# Patient Record
Sex: Female | Born: 1980
Health system: Southern US, Community
[De-identification: ages and names within clinical notes are randomized; demographics above are authoritative.]

## PROBLEM LIST (undated history)

## (undated) DIAGNOSIS — L309 Dermatitis, unspecified: Secondary | ICD-10-CM

## (undated) DIAGNOSIS — I10 Essential (primary) hypertension: Secondary | ICD-10-CM

## (undated) DIAGNOSIS — E282 Polycystic ovarian syndrome: Secondary | ICD-10-CM

## (undated) DIAGNOSIS — F419 Anxiety disorder, unspecified: Secondary | ICD-10-CM

## (undated) DIAGNOSIS — E559 Vitamin D deficiency, unspecified: Secondary | ICD-10-CM

## (undated) DIAGNOSIS — E119 Type 2 diabetes mellitus without complications: Secondary | ICD-10-CM

## (undated) HISTORY — DX: Polycystic ovarian syndrome: E28.2

## (undated) HISTORY — PX: WISDOM TOOTH EXTRACTION: SHX21

## (undated) HISTORY — DX: Vitamin D deficiency, unspecified: E55.9

## (undated) HISTORY — DX: Essential (primary) hypertension: I10

## (undated) HISTORY — DX: Type 2 diabetes mellitus without complications: E11.9

## (undated) HISTORY — DX: Anxiety disorder, unspecified: F41.9

---

## 2015-08-04 ENCOUNTER — Encounter (HOSPITAL_BASED_OUTPATIENT_CLINIC_OR_DEPARTMENT_OTHER): Payer: Self-pay

## 2015-08-04 ENCOUNTER — Emergency Department (HOSPITAL_BASED_OUTPATIENT_CLINIC_OR_DEPARTMENT_OTHER): Payer: Self-pay

## 2015-08-04 ENCOUNTER — Emergency Department (HOSPITAL_BASED_OUTPATIENT_CLINIC_OR_DEPARTMENT_OTHER)
Admission: EM | Admit: 2015-08-04 | Discharge: 2015-08-04 | Disposition: A | Discharge status: Home / Self Care | Payer: Self-pay | Attending: Emergency Medicine | Admitting: Emergency Medicine

## 2015-08-04 DIAGNOSIS — Z872 Personal history of diseases of the skin and subcutaneous tissue: Secondary | ICD-10-CM | POA: Insufficient documentation

## 2015-08-04 DIAGNOSIS — Z3202 Encounter for pregnancy test, result negative: Secondary | ICD-10-CM | POA: Insufficient documentation

## 2015-08-04 DIAGNOSIS — Z79899 Other long term (current) drug therapy: Secondary | ICD-10-CM | POA: Insufficient documentation

## 2015-08-04 DIAGNOSIS — Z880 Allergy status to penicillin: Secondary | ICD-10-CM | POA: Insufficient documentation

## 2015-08-04 DIAGNOSIS — R202 Paresthesia of skin: Secondary | ICD-10-CM | POA: Insufficient documentation

## 2015-08-04 DIAGNOSIS — R200 Anesthesia of skin: Secondary | ICD-10-CM | POA: Insufficient documentation

## 2015-08-04 DIAGNOSIS — I10 Essential (primary) hypertension: Secondary | ICD-10-CM | POA: Insufficient documentation

## 2015-08-04 DIAGNOSIS — Z7982 Long term (current) use of aspirin: Secondary | ICD-10-CM | POA: Insufficient documentation

## 2015-08-04 HISTORY — DX: Dermatitis, unspecified: L30.9

## 2015-08-04 LAB — COMPREHENSIVE METABOLIC PANEL
ALT: 17 U/L (ref 14–54)
AST: 22 U/L (ref 15–41)
Albumin: 4.1 g/dL (ref 3.5–5.0)
Alkaline Phosphatase: 73 U/L (ref 38–126)
Anion gap: 7 (ref 5–15)
BUN: 12 mg/dL (ref 6–20)
CO2: 24 mmol/L (ref 22–32)
Calcium: 9.5 mg/dL (ref 8.9–10.3)
Chloride: 105 mmol/L (ref 101–111)
Creatinine, Ser: 0.84 mg/dL (ref 0.44–1.00)
GFR calc Af Amer: >60 mL/min (ref >60)
GFR calc non Af Amer: >60 mL/min (ref >60)
Glucose, Bld: 126 mg/dL — ABNORMAL HIGH (ref 65–99)
Potassium: 3.6 mmol/L (ref 3.5–5.1)
Sodium: 136 mmol/L (ref 135–145)
Total Bilirubin: 0.7 mg/dL (ref 0.3–1.2)
Total Protein: 7.7 g/dL (ref 6.5–8.1)

## 2015-08-04 LAB — URINALYSIS, ROUTINE W REFLEX MICROSCOPIC
Bilirubin Urine: NEGATIVE
Glucose, UA: NEGATIVE mg/dL
Ketones, ur: NEGATIVE mg/dL
Leukocytes, UA: NEGATIVE
Nitrite: NEGATIVE
Protein, ur: NEGATIVE mg/dL
Specific Gravity, Urine: 1.004 — ABNORMAL LOW (ref 1.005–1.030)
pH: 6 (ref 5.0–8.0)

## 2015-08-04 LAB — DIFFERENTIAL
Basophils Absolute: 0.1 10*3/uL (ref 0.0–0.1)
Basophils Relative: 1 %
Eosinophils Absolute: 0.1 10*3/uL (ref 0.0–0.7)
Eosinophils Relative: 1 %
Lymphocytes Relative: 21 %
Lymphs Abs: 2 10*3/uL (ref 0.7–4.0)
Monocytes Absolute: 0.4 10*3/uL (ref 0.1–1.0)
Monocytes Relative: 4 %
Neutro Abs: 6.7 10*3/uL (ref 1.7–7.7)
Neutrophils Relative %: 73 %

## 2015-08-04 LAB — PROTIME-INR
INR: 1 (ref 0.00–1.49)
Prothrombin Time: 13.4 seconds (ref 11.6–15.2)

## 2015-08-04 LAB — CBC
HCT: 41.9 % (ref 36.0–46.0)
Hemoglobin: 14.5 g/dL (ref 12.0–15.0)
MCH: 28.5 pg (ref 26.0–34.0)
MCHC: 34.6 g/dL (ref 30.0–36.0)
MCV: 82.3 fL (ref 78.0–100.0)
Platelets: 317 10*3/uL (ref 150–400)
RBC: 5.09 MIL/uL (ref 3.87–5.11)
RDW: 12.4 % (ref 11.5–15.5)
WBC: 9.2 10*3/uL (ref 4.0–10.5)

## 2015-08-04 LAB — RAPID URINE DRUG SCREEN, HOSP PERFORMED
Amphetamines: NOT DETECTED
Barbiturates: NOT DETECTED
Benzodiazepines: NOT DETECTED
Cocaine: NOT DETECTED
Opiates: NOT DETECTED
Tetrahydrocannabinol: NOT DETECTED

## 2015-08-04 LAB — URINE MICROSCOPIC-ADD ON

## 2015-08-04 LAB — ETHANOL: Alcohol, Ethyl (B): <5 mg/dL (ref <5)

## 2015-08-04 LAB — APTT: aPTT: 33 seconds (ref 24–37)

## 2015-08-04 LAB — TROPONIN I: Troponin I: <0.03 ng/mL (ref <0.031)

## 2015-08-04 LAB — PREGNANCY, URINE: Preg Test, Ur: NEGATIVE

## 2015-08-04 MED ORDER — HYDROCHLOROTHIAZIDE 25 MG PO TABS: 25.0000 mg | ORAL_TABLET | Freq: Every day | ORAL | Status: DC

## 2015-08-04 MED ORDER — ASPIRIN 81 MG PO CHEW: 81.0000 mg | CHEWABLE_TABLET | Freq: Every day | ORAL | Status: DC

## 2015-08-04 NOTE — ED Provider Notes (Signed)
CSN: [id number]     Arrival date & time 08/04/15  1227 History  By signing my name below, I, Kendra Bennett, attest that this documentation has been prepared under the direction and in the presence of Kendra Octave, MD. Electronically Signed: Sonum Bennett, Neurosurgeon. 08/04/2015. 12:56 PM.    Chief Complaint  Patient presents with  . Hypertension   The history is provided by the patient. No language interpreter was used.     HPI Comments: Kendra Bennett is a 35 y.o. female who presents to the Emergency Department requesting evaluation for elevated blood pressure today. Patient states she checked her blood pressure at home and it was in the 180's/115. She has not been diagnosed with HTN and does not take any medications for this. She reports experiencing associated intermittent right hand digits numbness/paresthesias yesterday for 1 hour. She reports right lip numbness/paresthesias that last for about 2 hours earlier today. She reports taking doxycycline for a leg wound. She denies weakness, CP, SOB, visual disturbances, blurry/double vision, HA.   Past Medical History  Diagnosis Date  . Eczema    Past Surgical History  Procedure Laterality Date  . Wisdom tooth extraction     History reviewed. No pertinent family history. Social History  Substance Use Topics  . Smoking status: Never Smoker   . Smokeless tobacco: None  . Alcohol Use: No   OB History    No data available     Review of Systems  A complete 10 system review of systems was obtained and all systems are negative except as noted in the HPI and PMH.    Allergies  Penicillins and Prednisolone  Home Medications   Prior to Admission medications   Medication Sig Start Date End Date Taking? Authorizing Provider  cetirizine (ZYRTEC) 10 MG tablet Take 10 mg by mouth daily.   Yes Historical Provider, MD  levocetirizine (XYZAL) 5 MG tablet Take 5 mg by mouth every evening.   Yes Historical Provider, MD  aspirin 81 MG chewable  tablet Chew 1 tablet (81 mg total) by mouth daily. 08/04/15   Kendra Octave, MD  hydrochlorothiazide (HYDRODIURIL) 25 MG tablet Take 1 tablet (25 mg total) by mouth daily. 08/04/15   Kendra Octave, MD   BP 130/88 mmHg  Pulse 70  Temp(Src) 98.4 F (36.9 C) (Oral)  Resp 16  Ht  (1.549 m)  Wt 170 lb (77.111 kg)  BMI 32.14 kg/m2  SpO2 100%  LMP 07/28/2015 Physical Exam  Constitutional: She is oriented to person, place, and time. She appears well-developed and well-nourished. No distress.  HENT:  Head: Normocephalic and atraumatic.  Mouth/Throat: Oropharynx is clear and moist. No oropharyngeal exudate.  Eyes: Conjunctivae and EOM are normal. Pupils are equal, round, and reactive to light.  Neck: Normal range of motion. Neck supple.  No meningismus.  Cardiovascular: Normal rate, regular rhythm, normal heart sounds and intact distal pulses.   No murmur heard. Pulmonary/Chest: Effort normal and breath sounds normal. No respiratory distress.  Abdominal: Soft. There is no tenderness. There is no rebound and no guarding.  Musculoskeletal: Normal range of motion. She exhibits no edema or tenderness.  Neurological: She is alert and oriented to person, place, and time. No cranial nerve deficit. She exhibits normal muscle tone. Coordination normal.  No ataxia on finger to nose bilaterally. No pronator drift. 5/5 strength throughout. CN 2-12 intact.Equal grip strength. Sensation intact. No sensory deficits appreciated. Negative Romberg. Normal gait.   Skin: Skin is warm.  Psychiatric: She has  a normal mood and affect. Her behavior is normal.  Nursing note and vitals reviewed.   ED Course  Procedures (including critical care time)  DIAGNOSTIC STUDIES: Oxygen Saturation is 99% on RA, normal by my interpretation.    COORDINATION OF CARE: 1:03 PM Will order labs and imaging. Discussed treatment plan with pt at bedside and pt agreed to plan.   Labs Review Labs Reviewed  COMPREHENSIVE  METABOLIC PANEL - Abnormal; Notable for the following:    Glucose, Bld 126 (*)    All other components within normal limits  URINALYSIS, ROUTINE W REFLEX MICROSCOPIC (NOT AT [facility]) - Abnormal; Notable for the following:    APPearance CLOUDY (*)    Specific Gravity, Urine 1.004 (*)    Hgb urine dipstick SMALL (*)    All other components within normal limits  URINE MICROSCOPIC-ADD ON - Abnormal; Notable for the following:    Squamous Epithelial / LPF 6-30 (*)    Bacteria, UA FEW (*)    All other components within normal limits  ETHANOL  PROTIME-INR  APTT  CBC  DIFFERENTIAL  URINE RAPID DRUG SCREEN, HOSP PERFORMED  TROPONIN I  PREGNANCY, URINE    Imaging Review Ct Head Wo Contrast  08/04/2015  CLINICAL DATA:  Right-sided facial numbness and tingling today. Elevated blood pressure. EXAM: CT HEAD WITHOUT CONTRAST TECHNIQUE: Contiguous axial images were obtained from the base of the skull through the vertex without contrast. COMPARISON:  None FINDINGS: Normal appearance of the intracranial structures. No evidence for acute hemorrhage, mass lesion, midline shift, hydrocephalus or large infarct. No acute bony abnormality. The visualized sinuses are clear. IMPRESSION: Negative head CT. Electronically Signed   By: Richarda OverlieAdam  Henn M.D.   On: 08/04/2015 14:28   I have personally reviewed and evaluated these images and lab results as part of my medical decision-making.   EKG Interpretation   Date/Time:  Sunday August 04 2015 13:13:41 EDT Ventricular Rate:  61 PR Interval:  143 QRS Duration: 99 QT Interval:  398 QTC Calculation: 401 R Axis:   -27 Text Interpretation:  Sinus rhythm Borderline left axis deviation Abnormal  R-wave progression, late transition Borderline T abnormalities, inferior  leads No previous ECGs available Confirmed by Manus GunningANCOUR  MD, Keeli Roberg [phone]([id number])  on 08/04/2015 1:31:45 PM Also confirmed by Manus GunningANCOUR  MD, Styles Fambro [phone]([id number]),  editor Dan HumphreysWALKER, CCT, SANDRA ([id number])  on 08/04/2015 2:43:29  PM      MDM   Final diagnoses:  Hypertension, essential  Paresthesias   Patient states she has been feeling "bad" for the past 3 days and checked her blood pressure at home and found to be elevated. Had tingling of her hand lasting 1 hour yesterday and some tingling of her right face today lasting several minutes which is since resolved.  Neuro exam normal on arrival. No focal motor or sensory deficits.  Code stroke not activated.  Labs normal.  CT head negative.  MRI considered for TIA or other central neuro pathology.  It is not available at this facility today.  D/w Dr. Lavon PaganiniNandigam of neurology.  He agrees patient is appropriate for outpatient workup for MRI given her normal exam now. He also feels she should be evaluated for demyleinating disease.   D/w patient and offered transfer for MRI today. She would rather follow up as outpatient.  Her neuro exam is normal now.  Will start HCTZ and have follow up with PCP and neurology.  Start baby ASA.  Return precautions discussed.  I personally performed the services described  in this documentation, which was scribed in my presence. The recorded information has been reviewed and is accurate.   Kendra Octave, MD 08/04/15 [phone]

## 2015-08-04 NOTE — Discharge Instructions (Signed)
Paresthesia Follow-up with your doctor and the neurologist. You need Additional tests to evaluate for possible transient ischemic attack or other neurological problem. You need an MRI of your brain, carotid ultrasound, ultrasound of the heart. Take the blood pressure medicine as prescribed. Return to the ED if you develop new or worsening symptoms. Paresthesia is an abnormal burning or prickling sensation. This sensation is generally felt in the hands, arms, legs, or feet. However, it may occur in any part of the body. Usually, it is not painful. The feeling may be described as:  Tingling or numbness.  Pins and needles.  Skin crawling.  Buzzing.  Limbs falling asleep.  Itching. Most people experience temporary (transient) paresthesia at some time in their lives. Paresthesia may occur when you breathe too quickly (hyperventilation). It can also occur without any apparent cause. Commonly, paresthesia occurs when pressure is placed on a nerve. The sensation quickly goes away after the pressure is removed. For some people, however, paresthesia is a long-lasting (chronic) condition that is caused by an underlying disorder. If you continue to have paresthesia, you may need further medical evaluation. HOME CARE INSTRUCTIONS Watch your condition for any changes. Taking the following actions may help to lessen any discomfort that you are feeling:  Avoid drinking alcohol.  Try acupuncture or massage to help relieve your symptoms.  Keep all follow-up visits as directed by your health care provider. This is important. SEEK MEDICAL CARE IF:  You continue to have episodes of paresthesia.  Your burning or prickling feeling gets worse when you walk.  You have pain, cramps, or dizziness.  You develop a rash. SEEK IMMEDIATE MEDICAL CARE IF:  You feel weak.  You have trouble walking or moving.  You have problems with speech, understanding, or vision.  You feel confused.  You cannot control  your bladder or bowel movements.  You have numbness after an injury.  You faint.   This information is not intended to replace advice given to you by your health care provider. Make sure you discuss any questions you have with your health care provider.   Document Released: 04/03/2002 Document Revised: 08/28/2014 Document Reviewed: 04/09/2014 Elsevier Interactive Patient Education [employer]

## 2015-08-04 NOTE — ED Notes (Addendum)
Patient here with complaint of BP being up the past few days. Reports some intermittent facial numbness and arm tingling. Denies CP, denies shortness of breath

## 2015-08-19 ENCOUNTER — Ambulatory Visit: Payer: Self-pay | Admitting: Neurology

## 2015-08-19 ENCOUNTER — Telehealth: Payer: Self-pay | Admitting: *Deleted

## 2015-08-19 NOTE — Telephone Encounter (Addendum)
Patient called the answering service at 7pm the evening prior to her Monday morning 7am appointment.

## 2015-08-20 ENCOUNTER — Encounter: Payer: Self-pay | Admitting: Neurology

## 2016-03-04 DIAGNOSIS — I10 Essential (primary) hypertension: Secondary | ICD-10-CM | POA: Diagnosis not present

## 2016-03-04 DIAGNOSIS — L309 Dermatitis, unspecified: Secondary | ICD-10-CM | POA: Diagnosis not present

## 2016-03-22 DIAGNOSIS — L209 Atopic dermatitis, unspecified: Secondary | ICD-10-CM | POA: Diagnosis not present

## 2016-05-07 DIAGNOSIS — H01003 Unspecified blepharitis right eye, unspecified eyelid: Secondary | ICD-10-CM | POA: Diagnosis not present

## 2016-05-07 DIAGNOSIS — H01006 Unspecified blepharitis left eye, unspecified eyelid: Secondary | ICD-10-CM | POA: Diagnosis not present

## 2016-05-07 DIAGNOSIS — H16101 Unspecified superficial keratitis, right eye: Secondary | ICD-10-CM | POA: Diagnosis not present

## 2016-05-07 DIAGNOSIS — H02002 Unspecified entropion of right lower eyelid: Secondary | ICD-10-CM | POA: Diagnosis not present

## 2016-05-12 DIAGNOSIS — L299 Pruritus, unspecified: Secondary | ICD-10-CM | POA: Diagnosis not present

## 2016-05-12 DIAGNOSIS — B379 Candidiasis, unspecified: Secondary | ICD-10-CM | POA: Diagnosis not present

## 2016-05-12 DIAGNOSIS — L0293 Carbuncle, unspecified: Secondary | ICD-10-CM | POA: Diagnosis not present

## 2016-05-12 DIAGNOSIS — L2089 Other atopic dermatitis: Secondary | ICD-10-CM | POA: Diagnosis not present

## 2016-08-18 DIAGNOSIS — L0293 Carbuncle, unspecified: Secondary | ICD-10-CM | POA: Diagnosis not present

## 2016-08-18 DIAGNOSIS — L219 Seborrheic dermatitis, unspecified: Secondary | ICD-10-CM | POA: Diagnosis not present

## 2016-08-18 DIAGNOSIS — L2089 Other atopic dermatitis: Secondary | ICD-10-CM | POA: Diagnosis not present

## 2016-08-18 DIAGNOSIS — L739 Follicular disorder, unspecified: Secondary | ICD-10-CM | POA: Diagnosis not present

## 2016-10-06 DIAGNOSIS — L309 Dermatitis, unspecified: Secondary | ICD-10-CM | POA: Diagnosis not present

## 2016-10-06 DIAGNOSIS — R1013 Epigastric pain: Secondary | ICD-10-CM | POA: Diagnosis not present

## 2016-10-06 DIAGNOSIS — R509 Fever, unspecified: Secondary | ICD-10-CM | POA: Diagnosis not present

## 2016-10-08 ENCOUNTER — Other Ambulatory Visit: Payer: Self-pay | Admitting: Family Medicine

## 2016-10-08 DIAGNOSIS — R1013 Epigastric pain: Secondary | ICD-10-CM

## 2016-10-16 ENCOUNTER — Other Ambulatory Visit: Payer: Self-pay | Admitting: Family Medicine

## 2016-10-16 ENCOUNTER — Ambulatory Visit
Admission: RE | Admit: 2016-10-16 | Discharge: 2016-10-16 | Disposition: A | Discharge status: Home / Self Care | Payer: BLUE CROSS/BLUE SHIELD | Source: Ambulatory Visit | Attending: Family Medicine | Admitting: Family Medicine

## 2016-10-16 DIAGNOSIS — R1013 Epigastric pain: Secondary | ICD-10-CM

## 2016-10-16 DIAGNOSIS — R109 Unspecified abdominal pain: Secondary | ICD-10-CM | POA: Diagnosis not present

## 2017-02-16 DIAGNOSIS — E559 Vitamin D deficiency, unspecified: Secondary | ICD-10-CM | POA: Diagnosis not present

## 2017-02-16 DIAGNOSIS — R7303 Prediabetes: Secondary | ICD-10-CM | POA: Diagnosis not present

## 2017-02-16 DIAGNOSIS — Z79899 Other long term (current) drug therapy: Secondary | ICD-10-CM | POA: Diagnosis not present

## 2017-02-16 DIAGNOSIS — Z0000 Encounter for general adult medical examination without abnormal findings: Secondary | ICD-10-CM | POA: Diagnosis not present

## 2017-02-16 DIAGNOSIS — I10 Essential (primary) hypertension: Secondary | ICD-10-CM | POA: Diagnosis not present

## 2017-09-28 DIAGNOSIS — L309 Dermatitis, unspecified: Secondary | ICD-10-CM | POA: Diagnosis not present

## 2017-09-28 DIAGNOSIS — L304 Erythema intertrigo: Secondary | ICD-10-CM | POA: Diagnosis not present

## 2017-09-28 DIAGNOSIS — I10 Essential (primary) hypertension: Secondary | ICD-10-CM | POA: Diagnosis not present

## 2017-09-28 DIAGNOSIS — F411 Generalized anxiety disorder: Secondary | ICD-10-CM | POA: Diagnosis not present

## 2017-11-07 IMAGING — CT CT HEAD W/O CM
1 series · 16 of 30 positions shown, 20 images · non-contrast
Comparison: None

CLINICAL DATA: Right-sided facial numbness and tingling today.
Elevated blood pressure.

EXAM:
CT HEAD WITHOUT CONTRAST
TECHNIQUE: Contiguous axial images were obtained from the base of the skull
through the vertex without contrast.

[Series 2: head wo · axial · 0.44mm/px · z∈[+794,+929]mm · 16 of 33 slices shown, 20 images]
[im 2/33  brain]
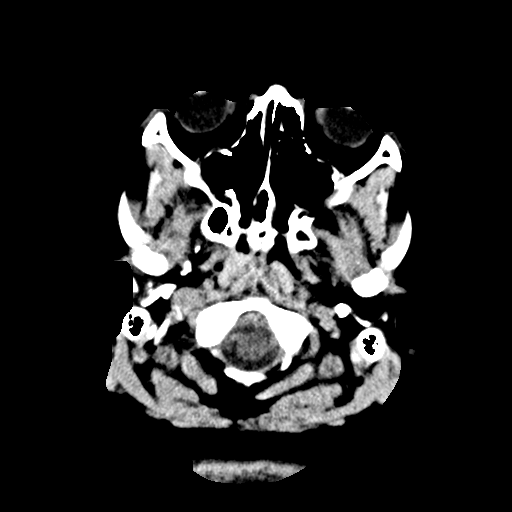
[im 2/33  bone]
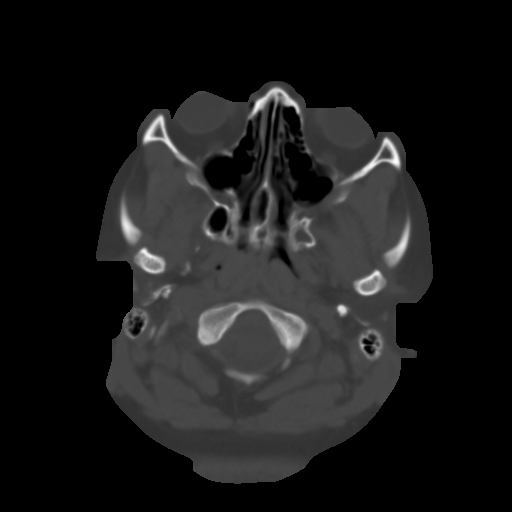
[im 4/33  brain]
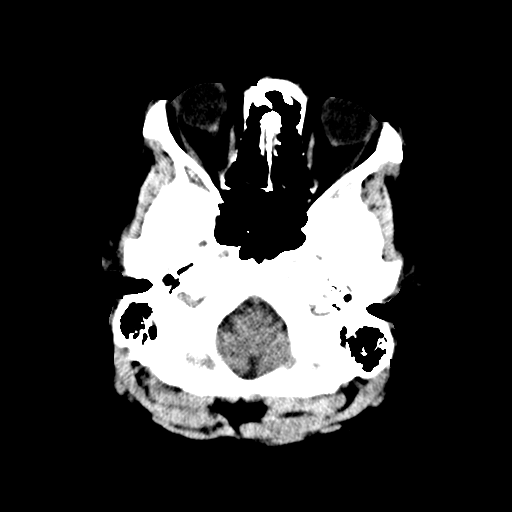
[im 6/33  brain]
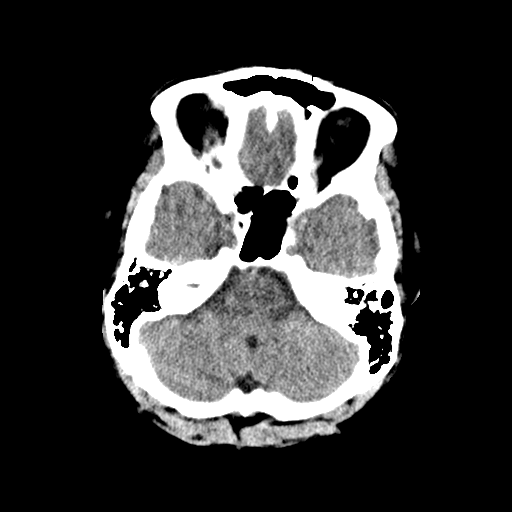
[im 8/33  brain]
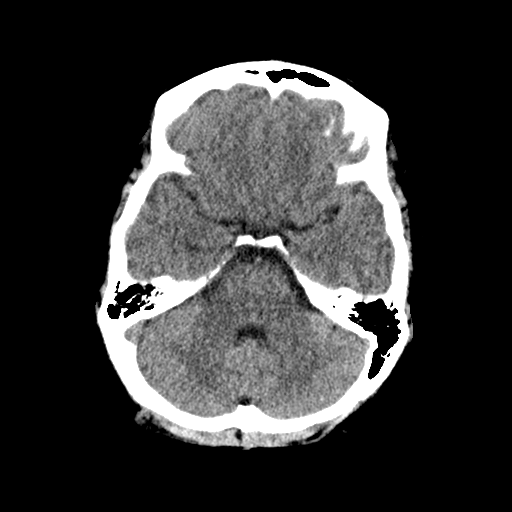
[im 9/33  brain]
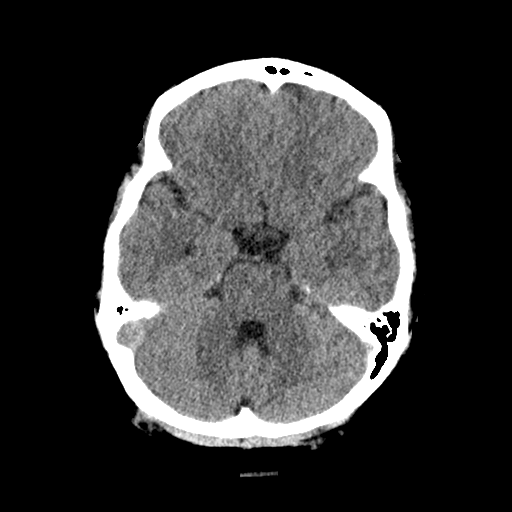
[im 9/33  bone]
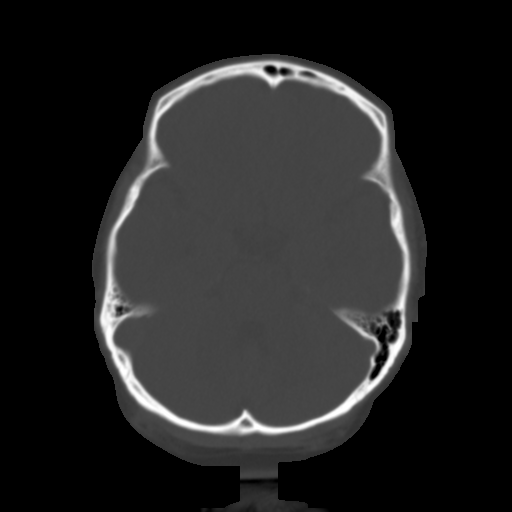
[im 12/33  brain]
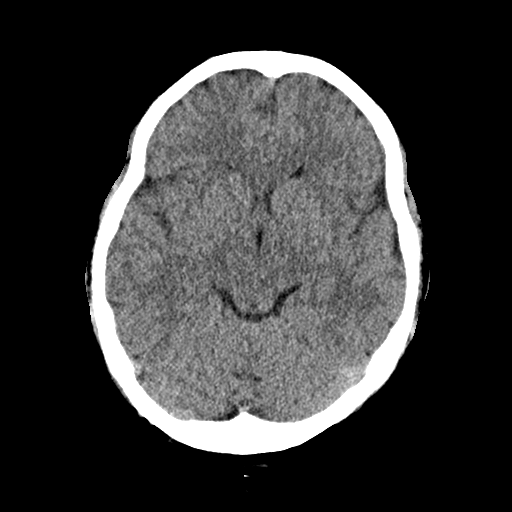
[im 14/33  brain]
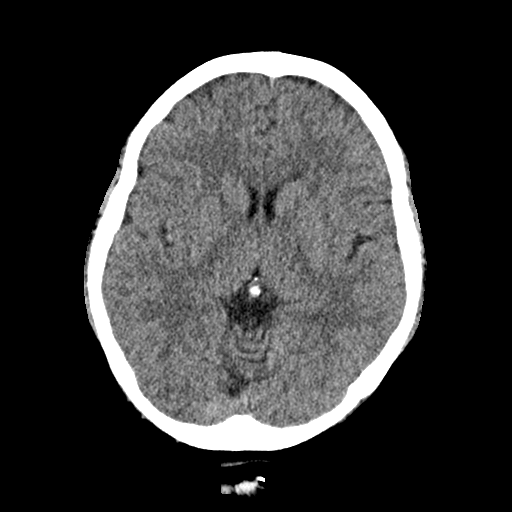
[im 16/33  brain]
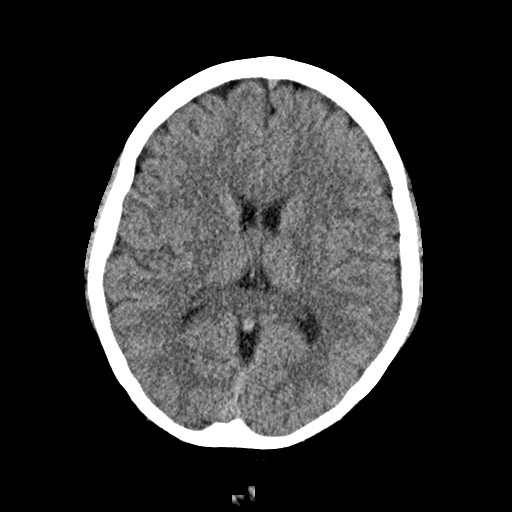
[im 17/33  brain]
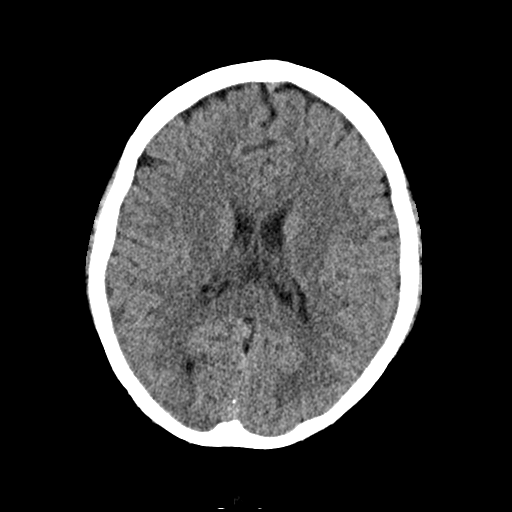
[im 17/33  bone]
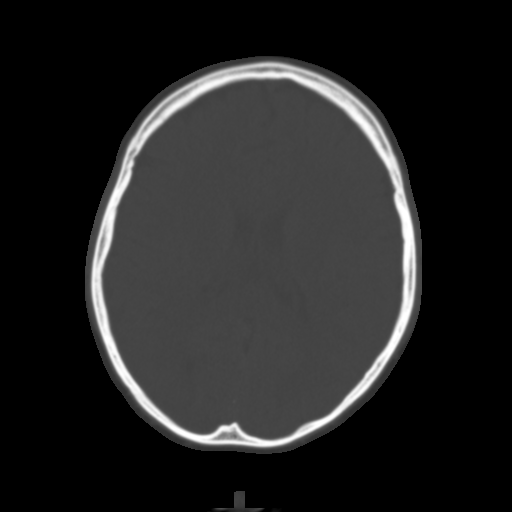
[im 19/33  brain]
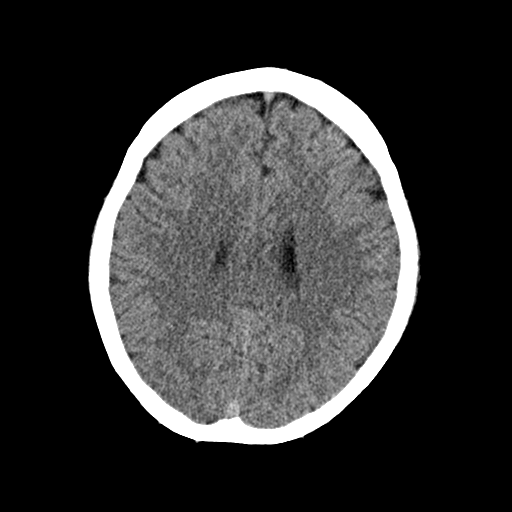
[im 21/33  brain]
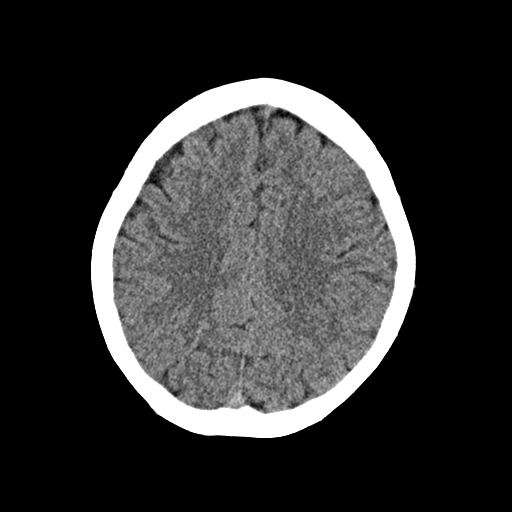
[im 24/33  brain]
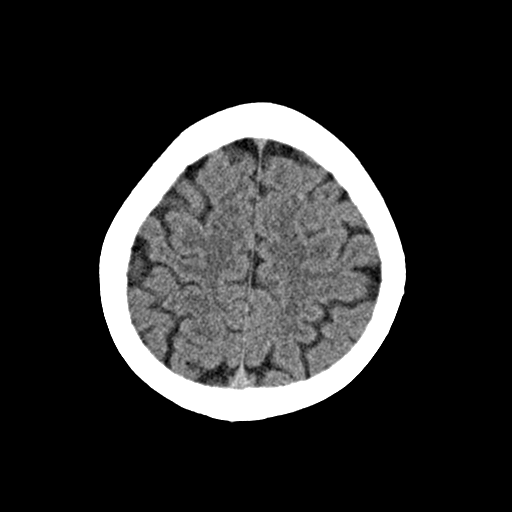
[im 25/33  brain]
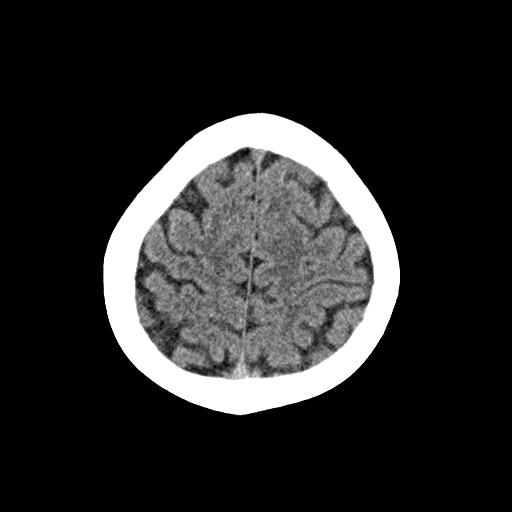
[im 25/33  bone]
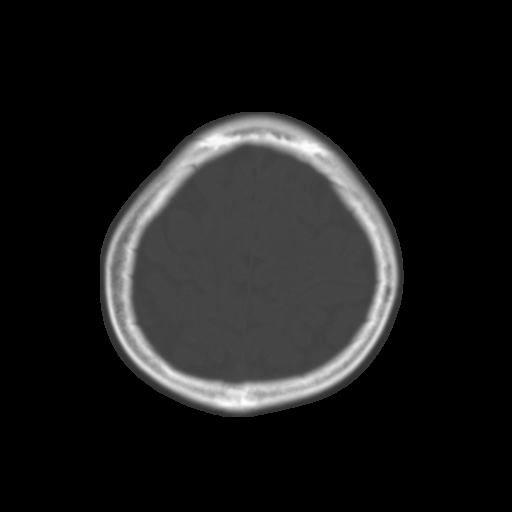
[im 27/33  brain]
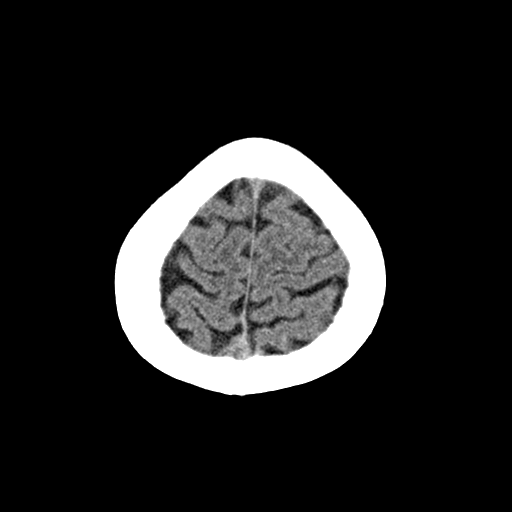
[im 29/33  brain]
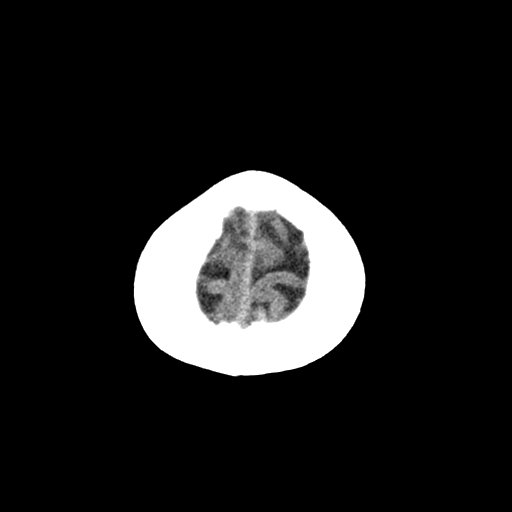
[im 31/33  brain]
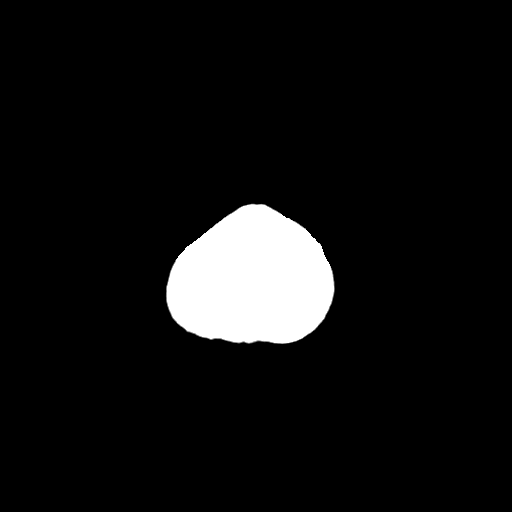

[16 of 30 positions shown; findings below may reference images not displayed]

FINDINGS: Normal appearance of the intracranial structures. No evidence for
acute hemorrhage, mass lesion, midline shift, hydrocephalus or large
infarct. No acute bony abnormality. The visualized sinuses are
clear.
IMPRESSION: Negative head CT.

## 2017-12-28 IMAGING — US US ABDOMEN COMPLETE
1 series · 13 of 25 positions shown · non-contrast
Comparison: None.

CLINICAL DATA: Abdominal pain

EXAM:
ABDOMEN ULTRASOUND COMPLETE

[Series 1: us abdomen complete · 0.22mm/px · 13 of 80 slices shown]
[im 1/80]
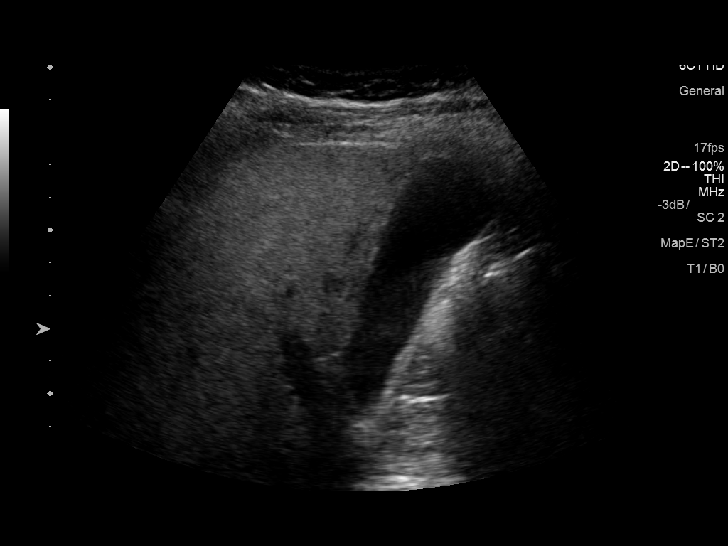
[im 7/80]
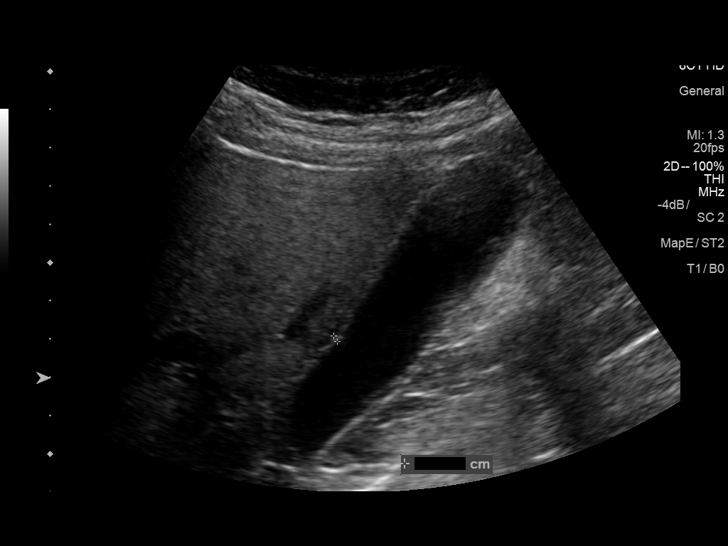
[im 14/80]
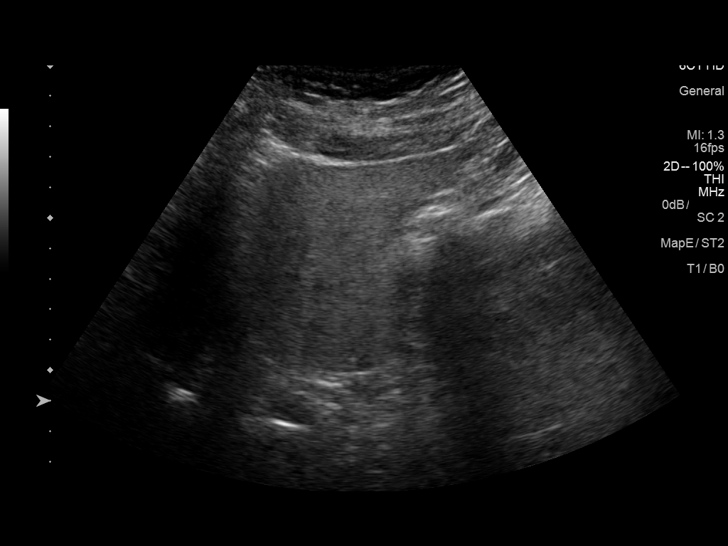
[im 20/80]
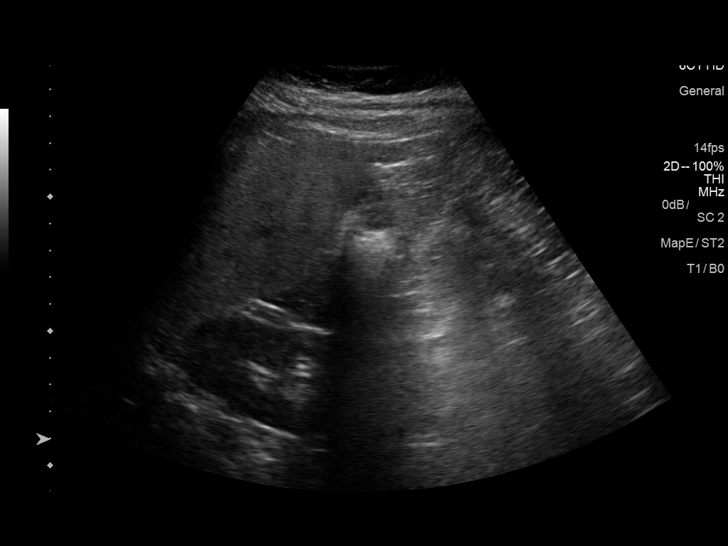
[im 27/80]
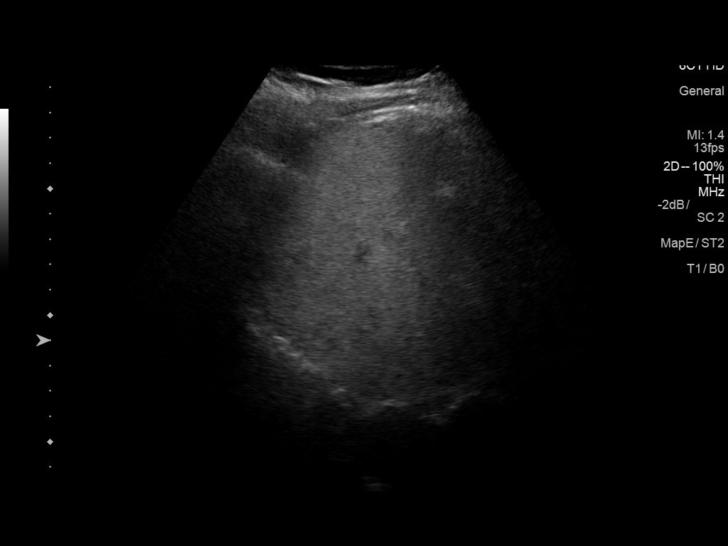
[im 33/80]
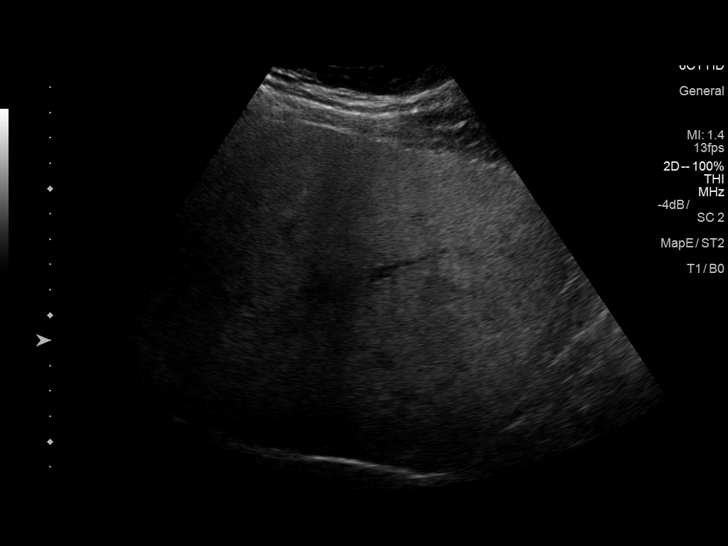
[im 40/80]
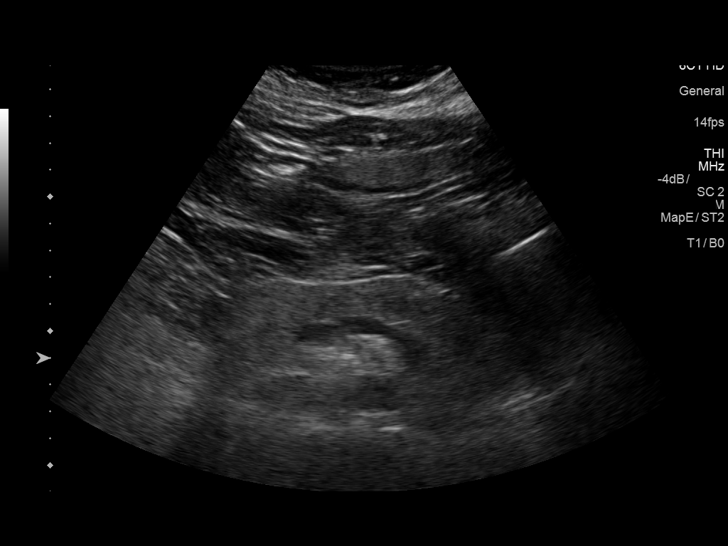
[im 47/80]
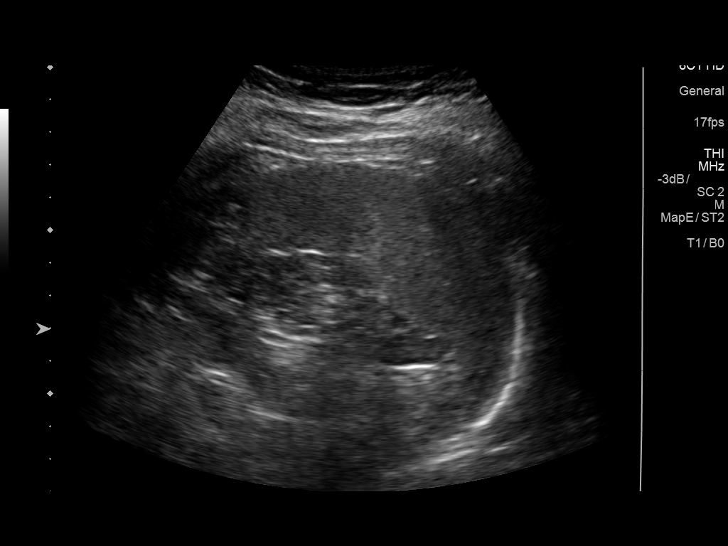
[im 53/80]
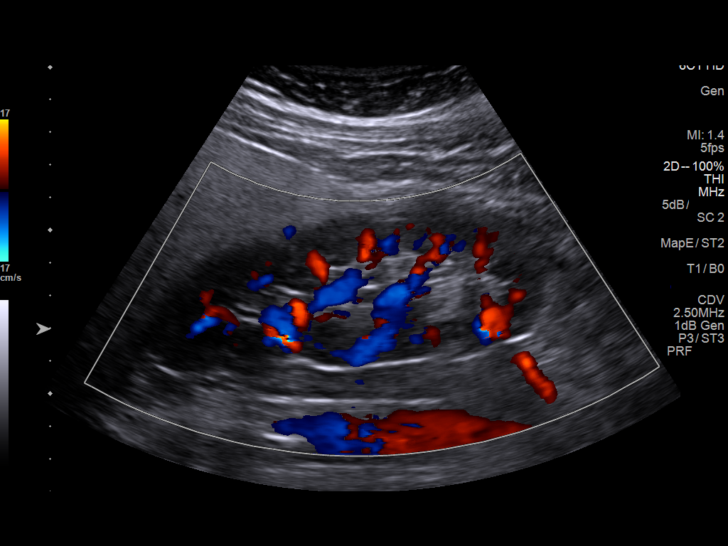
[im 60/80]
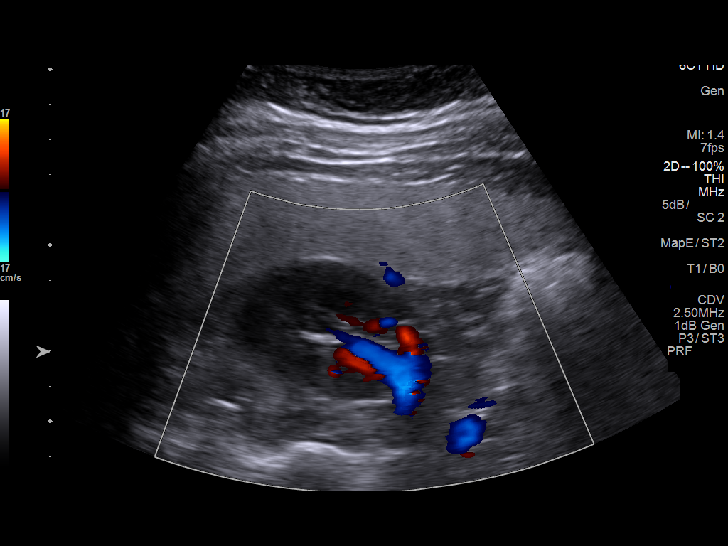
[im 66/80]
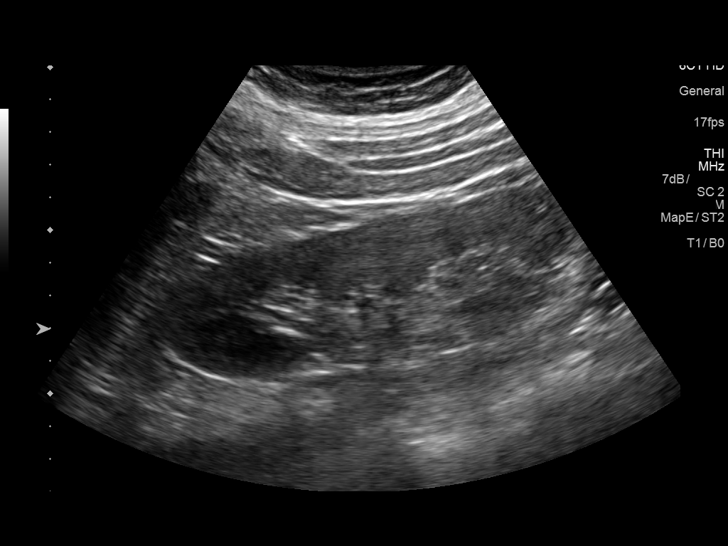
[im 73/80]
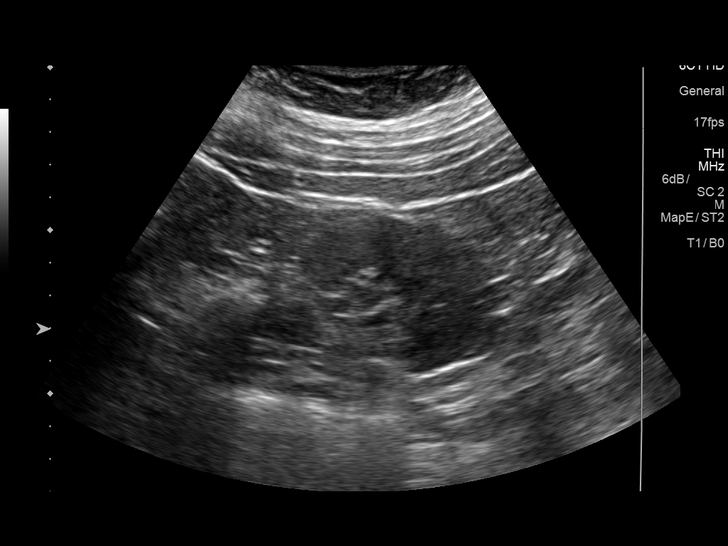
[im 80/80]
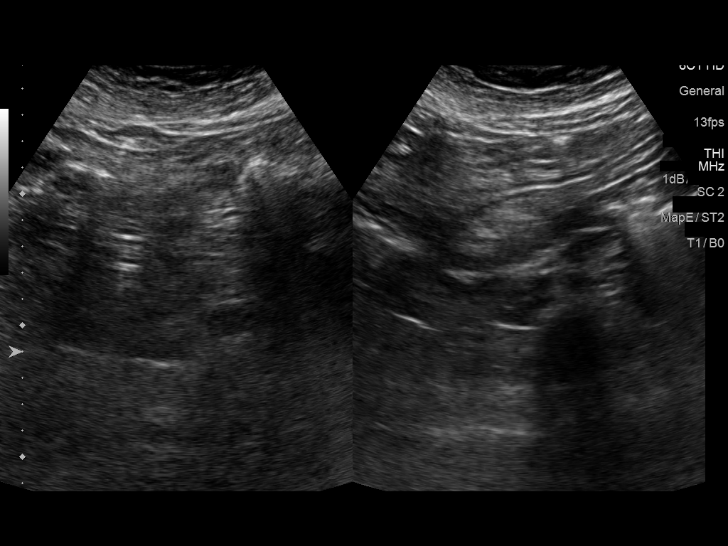

[13 of 25 positions shown; findings below may reference images not displayed]

FINDINGS: Gallbladder: No gallstones or wall thickening visualized. There is
no pericholecystic fluid. No sonographic Murphy sign noted by
sonographer.

Common bile duct: Diameter: 4 mm. There is no intrahepatic, common
hepatic, or common bile duct dilatation.

Liver: No focal lesion identified. Liver echogenicity is diffusely
increased.

IVC: No abnormality visualized in visualized regions. Portion
inferior vena cava obscured by gas.

Pancreas: Visualized portion unremarkable. Portions of pancreas
obscured by gas.

Spleen: Size and appearance within normal limits.

Right Kidney: Length: 13.4 cm. Echogenicity within normal limits. No
mass or hydronephrosis visualized.

Left Kidney: Length: 14.5 cm. Echogenicity within normal limits. No
mass or hydronephrosis visualized.

Abdominal aorta: No evident aneurysm.

Other findings: No demonstrable ascites.
IMPRESSION: Portions of pancreas and inferior vena cava obscured by gas.
Visualized portions of the studies appear normal.

Diffuse increase in liver echogenicity, a finding most likely
indicative of hepatic steatosis. While no focal liver lesions are
evident, it must be cautioned that the sensitivity of ultrasound for
detection of focal liver lesions is diminished in this circumstance.

Study otherwise unremarkable.

## 2018-01-02 DIAGNOSIS — L309 Dermatitis, unspecified: Secondary | ICD-10-CM | POA: Diagnosis not present

## 2018-01-03 DIAGNOSIS — L309 Dermatitis, unspecified: Secondary | ICD-10-CM | POA: Diagnosis not present

## 2018-01-03 DIAGNOSIS — M25571 Pain in right ankle and joints of right foot: Secondary | ICD-10-CM | POA: Diagnosis not present

## 2018-01-12 DIAGNOSIS — L662 Folliculitis decalvans: Secondary | ICD-10-CM | POA: Diagnosis not present

## 2018-01-12 DIAGNOSIS — L209 Atopic dermatitis, unspecified: Secondary | ICD-10-CM | POA: Diagnosis not present

## 2018-01-12 DIAGNOSIS — L0292 Furuncle, unspecified: Secondary | ICD-10-CM | POA: Diagnosis not present

## 2018-01-12 DIAGNOSIS — L2089 Other atopic dermatitis: Secondary | ICD-10-CM | POA: Diagnosis not present

## 2018-01-12 DIAGNOSIS — L219 Seborrheic dermatitis, unspecified: Secondary | ICD-10-CM | POA: Diagnosis not present

## 2018-01-12 DIAGNOSIS — L810 Postinflammatory hyperpigmentation: Secondary | ICD-10-CM | POA: Diagnosis not present

## 2018-03-22 DIAGNOSIS — R7303 Prediabetes: Secondary | ICD-10-CM | POA: Diagnosis not present

## 2018-03-22 DIAGNOSIS — I10 Essential (primary) hypertension: Secondary | ICD-10-CM | POA: Diagnosis not present

## 2018-03-22 DIAGNOSIS — Z0000 Encounter for general adult medical examination without abnormal findings: Secondary | ICD-10-CM | POA: Diagnosis not present

## 2018-03-22 DIAGNOSIS — Z79899 Other long term (current) drug therapy: Secondary | ICD-10-CM | POA: Diagnosis not present

## 2018-03-22 DIAGNOSIS — E559 Vitamin D deficiency, unspecified: Secondary | ICD-10-CM | POA: Diagnosis not present

## 2018-04-10 DIAGNOSIS — J209 Acute bronchitis, unspecified: Secondary | ICD-10-CM | POA: Diagnosis not present

## 2018-11-02 DIAGNOSIS — L639 Alopecia areata, unspecified: Secondary | ICD-10-CM | POA: Diagnosis not present

## 2018-11-02 DIAGNOSIS — L219 Seborrheic dermatitis, unspecified: Secondary | ICD-10-CM | POA: Diagnosis not present

## 2018-11-02 DIAGNOSIS — L2089 Other atopic dermatitis: Secondary | ICD-10-CM | POA: Diagnosis not present

## 2018-11-21 DIAGNOSIS — Z1159 Encounter for screening for other viral diseases: Secondary | ICD-10-CM | POA: Diagnosis not present

## 2018-12-26 DIAGNOSIS — Z20828 Contact with and (suspected) exposure to other viral communicable diseases: Secondary | ICD-10-CM | POA: Diagnosis not present

## 2018-12-26 DIAGNOSIS — U071 COVID-19: Secondary | ICD-10-CM | POA: Diagnosis not present

## 2019-02-02 DIAGNOSIS — Z1159 Encounter for screening for other viral diseases: Secondary | ICD-10-CM | POA: Diagnosis not present

## 2019-02-02 DIAGNOSIS — U071 COVID-19: Secondary | ICD-10-CM | POA: Diagnosis not present

## 2019-04-15 DIAGNOSIS — L209 Atopic dermatitis, unspecified: Secondary | ICD-10-CM | POA: Diagnosis not present

## 2019-04-15 DIAGNOSIS — T22211A Burn of second degree of right forearm, initial encounter: Secondary | ICD-10-CM | POA: Diagnosis not present

## 2019-04-27 DIAGNOSIS — R1110 Vomiting, unspecified: Secondary | ICD-10-CM | POA: Diagnosis not present

## 2019-04-27 DIAGNOSIS — R109 Unspecified abdominal pain: Secondary | ICD-10-CM | POA: Diagnosis not present

## 2019-04-27 DIAGNOSIS — R1111 Vomiting without nausea: Secondary | ICD-10-CM | POA: Diagnosis not present

## 2019-04-27 DIAGNOSIS — Z03818 Encounter for observation for suspected exposure to other biological agents ruled out: Secondary | ICD-10-CM | POA: Diagnosis not present

## 2019-05-24 ENCOUNTER — Other Ambulatory Visit (HOSPITAL_COMMUNITY)
Admission: RE | Admit: 2019-05-24 | Discharge: 2019-05-24 | Disposition: A | Discharge status: Home / Self Care | Payer: BC Managed Care – PPO | Source: Ambulatory Visit | Attending: Family Medicine | Admitting: Family Medicine

## 2019-05-24 ENCOUNTER — Other Ambulatory Visit: Payer: Self-pay | Admitting: Family Medicine

## 2019-05-24 DIAGNOSIS — Z01411 Encounter for gynecological examination (general) (routine) with abnormal findings: Secondary | ICD-10-CM | POA: Insufficient documentation

## 2019-05-24 DIAGNOSIS — Z79899 Other long term (current) drug therapy: Secondary | ICD-10-CM | POA: Diagnosis not present

## 2019-05-24 DIAGNOSIS — E119 Type 2 diabetes mellitus without complications: Secondary | ICD-10-CM | POA: Diagnosis not present

## 2019-05-24 DIAGNOSIS — Z0000 Encounter for general adult medical examination without abnormal findings: Secondary | ICD-10-CM | POA: Diagnosis not present

## 2019-05-24 DIAGNOSIS — E559 Vitamin D deficiency, unspecified: Secondary | ICD-10-CM | POA: Diagnosis not present

## 2019-05-30 LAB — CYTOLOGY - PAP
Chlamydia: NEGATIVE
Comment: NEGATIVE
Comment: NEGATIVE
Comment: NEGATIVE
Comment: NORMAL
Diagnosis: NEGATIVE
High risk HPV: NEGATIVE
Neisseria Gonorrhea: NEGATIVE
Trichomonas: NEGATIVE

## 2019-07-03 DIAGNOSIS — L2084 Intrinsic (allergic) eczema: Secondary | ICD-10-CM | POA: Diagnosis not present

## 2019-07-03 DIAGNOSIS — L299 Pruritus, unspecified: Secondary | ICD-10-CM | POA: Diagnosis not present

## 2019-09-15 DIAGNOSIS — H02052 Trichiasis without entropian right lower eyelid: Secondary | ICD-10-CM | POA: Diagnosis not present

## 2019-11-29 DIAGNOSIS — Z20822 Contact with and (suspected) exposure to covid-19: Secondary | ICD-10-CM | POA: Diagnosis not present

## 2019-11-29 DIAGNOSIS — Z03818 Encounter for observation for suspected exposure to other biological agents ruled out: Secondary | ICD-10-CM | POA: Diagnosis not present

## 2020-07-18 DIAGNOSIS — L810 Postinflammatory hyperpigmentation: Secondary | ICD-10-CM | POA: Diagnosis not present

## 2020-07-18 DIAGNOSIS — L299 Pruritus, unspecified: Secondary | ICD-10-CM | POA: Diagnosis not present

## 2020-07-18 DIAGNOSIS — L2089 Other atopic dermatitis: Secondary | ICD-10-CM | POA: Diagnosis not present

## 2020-08-08 DIAGNOSIS — R059 Cough, unspecified: Secondary | ICD-10-CM | POA: Diagnosis not present

## 2020-08-08 DIAGNOSIS — R0981 Nasal congestion: Secondary | ICD-10-CM | POA: Diagnosis not present

## 2020-08-08 DIAGNOSIS — J309 Allergic rhinitis, unspecified: Secondary | ICD-10-CM | POA: Diagnosis not present

## 2020-08-08 DIAGNOSIS — L209 Atopic dermatitis, unspecified: Secondary | ICD-10-CM | POA: Diagnosis not present

## 2020-12-04 DIAGNOSIS — L2084 Intrinsic (allergic) eczema: Secondary | ICD-10-CM | POA: Insufficient documentation

## 2020-12-04 DIAGNOSIS — J302 Other seasonal allergic rhinitis: Secondary | ICD-10-CM | POA: Diagnosis not present

## 2020-12-04 DIAGNOSIS — L2089 Other atopic dermatitis: Secondary | ICD-10-CM | POA: Diagnosis not present

## 2021-03-12 DIAGNOSIS — I10 Essential (primary) hypertension: Secondary | ICD-10-CM | POA: Diagnosis not present

## 2021-03-12 DIAGNOSIS — E119 Type 2 diabetes mellitus without complications: Secondary | ICD-10-CM | POA: Diagnosis not present

## 2021-03-12 DIAGNOSIS — N912 Amenorrhea, unspecified: Secondary | ICD-10-CM | POA: Diagnosis not present

## 2021-03-12 DIAGNOSIS — Z79899 Other long term (current) drug therapy: Secondary | ICD-10-CM | POA: Diagnosis not present

## 2021-03-12 DIAGNOSIS — E559 Vitamin D deficiency, unspecified: Secondary | ICD-10-CM | POA: Diagnosis not present

## 2021-06-01 DIAGNOSIS — R051 Acute cough: Secondary | ICD-10-CM | POA: Diagnosis not present

## 2021-06-01 DIAGNOSIS — R042 Hemoptysis: Secondary | ICD-10-CM | POA: Diagnosis not present

## 2021-06-01 DIAGNOSIS — U071 COVID-19: Secondary | ICD-10-CM | POA: Diagnosis not present

## 2021-09-05 ENCOUNTER — Ambulatory Visit: Payer: BC Managed Care – PPO | Admitting: Family

## 2021-09-05 ENCOUNTER — Encounter: Payer: Self-pay | Admitting: Family

## 2021-09-05 VITALS — BP 110/80 | HR 93 | Temp 98.4°F | Ht 61.0 in | Wt 188.8 lb

## 2021-09-05 DIAGNOSIS — R635 Abnormal weight gain: Secondary | ICD-10-CM

## 2021-09-05 DIAGNOSIS — I10 Essential (primary) hypertension: Secondary | ICD-10-CM

## 2021-09-05 DIAGNOSIS — L309 Dermatitis, unspecified: Secondary | ICD-10-CM

## 2021-09-05 DIAGNOSIS — E282 Polycystic ovarian syndrome: Secondary | ICD-10-CM

## 2021-09-05 DIAGNOSIS — Z6835 Body mass index (BMI) 35.0-35.9, adult: Secondary | ICD-10-CM

## 2021-09-05 DIAGNOSIS — R7309 Other abnormal glucose: Secondary | ICD-10-CM | POA: Diagnosis not present

## 2021-09-05 NOTE — Patient Instructions (Signed)
This is the note from your most recent dermatology visit- Dr. Reche Dixon ? ?"Please call our clinic at [phone] with any questions or possible complications of your skin condition or treatment. " ?

## 2021-09-05 NOTE — Progress Notes (Signed)
?Kendra Bennett is a 41 y.o. female with the following history as recorded in EpicCare:  ?There are no problems to display for this patient. ?  ?Current Outpatient Medications  ?Medication Sig Dispense Refill  ? cetirizine (ZYRTEC) 10 MG tablet Take 10 mg by mouth daily.    ? clobetasol ointment (TEMOVATE) 0.05 % Use 2 x/day to thick areas    ? desonide (DESOWEN) 0.05 % cream Apply to the face 1-2 times a day as needed for atopic dermatitis    ? DUPIXENT 300 MG/2ML prefilled syringe Inject into the skin.    ? norgestimate-ethinyl estradiol (ORTHO-CYCLEN) 0.25-35 MG-MCG tablet Take 1 tablet by mouth daily.    ? olmesartan (BENICAR) 20 MG tablet Take 10 mg by mouth daily.    ? ?No current facility-administered medications for this visit.  ?  ?Allergies: Ciprofloxacin, Penicillins, and Prednisolone  ?Past Medical History:  ?Diagnosis Date  ? Eczema   ?  ?Past Surgical History:  ?Procedure Laterality Date  ? WISDOM TOOTH EXTRACTION    ?  ?No family history on file.  ?Social History  ? ?Tobacco Use  ? Smoking status: Never  ? Smokeless tobacco: Not on file  ?Substance Use Topics  ? Alcohol use: No  ?  ?Subjective:  ?Presents today as a new patient; ?History of eczema- overdue to see her dermatologist; on [facility] x 2-3 years; initial good response but notes not feeling as beneficial recently;  ? ?Would like to discuss weight loss drugs- thinks she is pre-diabetic; notes last set of labs were done in October 2022; took Ozempic for one month and lost 8 pounds; able to keep off for about 3-4 months but admits back to "craving sugar/ hard to control appetite." ? ?Would also like to see specialist to discuss fertility options; has PCOS- periods are very irregular if she is not on OCPs;  ? ? ? ? ?Objective:  ?Vitals:  ? 09/05/21 1411  ?BP: 110/80  ?Pulse: 93  ?Temp: 98.4 ?F (36.9 ?C)  ?TempSrc: Oral  ?SpO2: 98%  ?Weight: 188 lb 12.8 oz (85.6 kg)  ?Height: '5\' 1"'  (1.549 m)  ?  ?General: Well developed, well nourished, in no  acute distress  ?Skin : Warm and dry.  ?Head: Normocephalic and atraumatic  ?Eyes: Sclera and conjunctiva clear; pupils round and reactive to light; extraocular movements intact  ?Ears: External normal; canals clear; tympanic membranes normal  ?Oropharynx: Pink, supple. No suspicious lesions  ?Neck: Supple without thyromegaly, adenopathy  ?Lungs: Respirations unlabored; clear to auscultation bilaterally without wheeze, rales, rhonchi  ?CVS exam: normal rate and regular rhythm.  ?Neurologic: Alert and oriented; speech intact; face symmetrical; moves all extremities well; CNII-XII intact without focal deficit  ? ?Assessment:  ?1. BMI 35.0-35.9,adult   ?2. Weight gain   ?3. Elevated glucose   ?4. PCOS (polycystic ovarian syndrome)   ?5. Primary hypertension   ?6. Eczema, unspecified type   ?  ?Plan:  ?Will update labs today; to consider trial of weight loss medication based on Hgba1c; follow up to be determined; ?2. Will refer to fertility specialist; ?3.  Stable; continue same medications for now- she understands this medication will need to be changed if she does decide to pursue pregnancy; ?4. She will follow up with her dermatologist- overdue for OV;  ?Follow up to be determined based on lab results;  ?No follow-ups on file.  ?Orders Placed This Encounter  ?Procedures  ? CBC with Differential/Platelet  ? Comp Met (CMET)  ? Hemoglobin A1c  ?  TSH  ? Amb Ref to Medical Weight Management  ?  Referral Priority:   Routine  ?  Referral Type:   Consultation  ?  Number of Visits Requested:   1  ? Ambulatory referral to Endocrinology  ?  Referral Priority:   Routine  ?  Referral Type:   Consultation  ?  Referral Reason:   Specialty Services Required  ?  Number of Visits Requested:   1  ?  ?Requested Prescriptions  ? ? No prescriptions requested or ordered in this encounter  ?  ? ?

## 2021-09-06 LAB — COMPREHENSIVE METABOLIC PANEL
AG Ratio: 1.5 (calc) (ref 1.0–2.5)
ALT: 13 U/L (ref 6–29)
AST: 12 U/L (ref 10–30)
Albumin: 4.3 g/dL (ref 3.6–5.1)
Alkaline phosphatase (APISO): 68 U/L (ref 31–125)
BUN: 8 mg/dL (ref 7–25)
CO2: 22 mmol/L (ref 20–32)
Calcium: 9.6 mg/dL (ref 8.6–10.2)
Chloride: 101 mmol/L (ref 98–110)
Creat: 0.61 mg/dL (ref 0.50–0.99)
Globulin: 2.9 g/dL (calc) (ref 1.9–3.7)
Glucose, Bld: 111 mg/dL — ABNORMAL HIGH (ref 65–99)
Potassium: 4 mmol/L (ref 3.5–5.3)
Sodium: 136 mmol/L (ref 135–146)
Total Bilirubin: 0.6 mg/dL (ref 0.2–1.2)
Total Protein: 7.2 g/dL (ref 6.1–8.1)

## 2021-09-06 LAB — CBC WITH DIFFERENTIAL/PLATELET
Absolute Monocytes: 427 cells/uL (ref 200–950)
Basophils Absolute: 62 cells/uL (ref 0–200)
Basophils Relative: 0.7 %
Eosinophils Absolute: 214 cells/uL (ref 15–500)
Eosinophils Relative: 2.4 %
HCT: 44.4 % (ref 35.0–45.0)
Hemoglobin: 14.6 g/dL (ref 11.7–15.5)
Lymphs Abs: 2109 cells/uL (ref 850–3900)
MCH: 27.7 pg (ref 27.0–33.0)
MCHC: 32.9 g/dL (ref 32.0–36.0)
MCV: 84.1 fL (ref 80.0–100.0)
MPV: 9.9 fL (ref 7.5–12.5)
Monocytes Relative: 4.8 %
Neutro Abs: 6088 cells/uL (ref 1500–7800)
Neutrophils Relative %: 68.4 %
Platelets: 298 10*3/uL (ref 140–400)
RBC: 5.28 10*6/uL — ABNORMAL HIGH (ref 3.80–5.10)
RDW: 12.8 % (ref 11.0–15.0)
Total Lymphocyte: 23.7 %
WBC: 8.9 10*3/uL (ref 3.8–10.8)

## 2021-09-06 LAB — TSH: TSH: 2.21 mIU/L

## 2021-09-06 LAB — HEMOGLOBIN A1C
Hgb A1c MFr Bld: 7.1 % of total Hgb — ABNORMAL HIGH (ref <5.7)
Mean Plasma Glucose: 157 mg/dL
eAG (mmol/L): 8.7 mmol/L

## 2021-09-09 ENCOUNTER — Other Ambulatory Visit: Payer: Self-pay | Admitting: Family

## 2021-09-09 MED ORDER — METFORMIN HCL 500 MG PO TABS: 500.0000 mg | ORAL_TABLET | Freq: Two times a day (BID) | ORAL | 0 refills | Status: DC

## 2021-09-24 ENCOUNTER — Encounter: Payer: Self-pay | Admitting: Family

## 2021-09-24 ENCOUNTER — Telehealth: Payer: Self-pay | Admitting: Family

## 2021-09-24 NOTE — Telephone Encounter (Signed)
Pt would like to know if she can be prescribed a rx for ozempic or mounjaro for blood sugar.

## 2021-09-24 NOTE — Telephone Encounter (Signed)
Called patient and she stated that she feels like the metformin is not working.  I asked was she checking her sugars and if so what was the readings.  She said no that she was not checking her sugars, she just know that its not working.

## 2021-09-26 ENCOUNTER — Other Ambulatory Visit: Payer: Self-pay | Admitting: Family

## 2021-09-26 MED ORDER — TIRZEPATIDE 2.5 MG/0.5ML ~~LOC~~ SOAJ: 2.5000 mg | SUBCUTANEOUS | 0 refills | Status: DC

## 2021-09-26 NOTE — Telephone Encounter (Signed)
Spoke with patient and she stated that she is planning on getting pregnant may next year.  She wants to try the [facility].  She has an appointment with the fertility clinic.  I asked did she want to wait til see what they say and she stated that she just want to start the medication because she does not want to be a diabetic and pregnant.

## 2021-10-03 ENCOUNTER — Encounter: Payer: Self-pay | Admitting: Family

## 2021-10-07 ENCOUNTER — Other Ambulatory Visit: Payer: Self-pay | Admitting: Family

## 2021-10-07 MED ORDER — TRULICITY 0.75 MG/0.5ML ~~LOC~~ SOAJ: 0.7500 mg | SUBCUTANEOUS | 2 refills | Status: DC

## 2021-10-08 ENCOUNTER — Telehealth: Payer: Self-pay

## 2021-10-08 NOTE — Telephone Encounter (Signed)
PA initiated via Covermymeds; KEY: [id number]. Awaiting determination.

## 2021-10-08 NOTE — Telephone Encounter (Signed)
PA approved. Effective 10/08/2021 to 10/08/2022

## 2021-10-08 NOTE — Telephone Encounter (Signed)
Trulicity not covered- Pt must first try and fail metformin.

## 2021-10-10 DIAGNOSIS — L2082 Flexural eczema: Secondary | ICD-10-CM | POA: Diagnosis not present

## 2021-10-14 ENCOUNTER — Other Ambulatory Visit: Payer: Self-pay | Admitting: Family

## 2021-10-31 DIAGNOSIS — L209 Atopic dermatitis, unspecified: Secondary | ICD-10-CM | POA: Diagnosis not present

## 2021-10-31 DIAGNOSIS — L700 Acne vulgaris: Secondary | ICD-10-CM | POA: Diagnosis not present

## 2021-11-01 ENCOUNTER — Encounter: Payer: Self-pay | Admitting: Family

## 2021-11-30 ENCOUNTER — Other Ambulatory Visit: Payer: Self-pay | Admitting: Family

## 2021-12-24 ENCOUNTER — Encounter (INDEPENDENT_AMBULATORY_CARE_PROVIDER_SITE_OTHER): Payer: Self-pay

## 2021-12-24 DIAGNOSIS — L2089 Other atopic dermatitis: Secondary | ICD-10-CM | POA: Diagnosis not present

## 2021-12-24 DIAGNOSIS — Z5181 Encounter for therapeutic drug level monitoring: Secondary | ICD-10-CM | POA: Diagnosis not present

## 2021-12-24 DIAGNOSIS — Z0289 Encounter for other administrative examinations: Secondary | ICD-10-CM

## 2021-12-24 DIAGNOSIS — L810 Postinflammatory hyperpigmentation: Secondary | ICD-10-CM | POA: Diagnosis not present

## 2022-01-01 ENCOUNTER — Encounter: Payer: Self-pay | Admitting: Family

## 2022-01-03 ENCOUNTER — Other Ambulatory Visit: Payer: Self-pay | Admitting: Family

## 2022-01-08 ENCOUNTER — Ambulatory Visit: Payer: BC Managed Care – PPO | Admitting: Family

## 2022-01-12 ENCOUNTER — Ambulatory Visit: Payer: BC Managed Care – PPO | Admitting: Bariatrics

## 2022-01-12 ENCOUNTER — Encounter: Payer: Self-pay | Admitting: Bariatrics

## 2022-01-12 VITALS — BP 113/75 | HR 70 | Temp 97.6°F | Ht 61.0 in | Wt 187.0 lb

## 2022-01-12 DIAGNOSIS — E282 Polycystic ovarian syndrome: Secondary | ICD-10-CM | POA: Diagnosis not present

## 2022-01-12 DIAGNOSIS — R5383 Other fatigue: Secondary | ICD-10-CM

## 2022-01-12 DIAGNOSIS — E669 Obesity, unspecified: Secondary | ICD-10-CM | POA: Diagnosis not present

## 2022-01-12 DIAGNOSIS — Z1331 Encounter for screening for depression: Secondary | ICD-10-CM

## 2022-01-12 DIAGNOSIS — Z0000 Encounter for general adult medical examination without abnormal findings: Secondary | ICD-10-CM | POA: Insufficient documentation

## 2022-01-12 DIAGNOSIS — E1169 Type 2 diabetes mellitus with other specified complication: Secondary | ICD-10-CM | POA: Diagnosis not present

## 2022-01-12 DIAGNOSIS — E538 Deficiency of other specified B group vitamins: Secondary | ICD-10-CM | POA: Diagnosis not present

## 2022-01-12 DIAGNOSIS — E559 Vitamin D deficiency, unspecified: Secondary | ICD-10-CM

## 2022-01-12 DIAGNOSIS — R0602 Shortness of breath: Secondary | ICD-10-CM | POA: Diagnosis not present

## 2022-01-12 DIAGNOSIS — I10 Essential (primary) hypertension: Secondary | ICD-10-CM | POA: Diagnosis not present

## 2022-01-12 DIAGNOSIS — E66812 Obesity, class 2: Secondary | ICD-10-CM | POA: Insufficient documentation

## 2022-01-12 DIAGNOSIS — Z6835 Body mass index (BMI) 35.0-35.9, adult: Secondary | ICD-10-CM

## 2022-01-12 DIAGNOSIS — Z7985 Long-term (current) use of injectable non-insulin antidiabetic drugs: Secondary | ICD-10-CM

## 2022-01-12 MED ORDER — TRULICITY 1.5 MG/0.5ML ~~LOC~~ SOAJ: 1.5000 mg | SUBCUTANEOUS | 0 refills | Status: DC

## 2022-01-13 LAB — COMPREHENSIVE METABOLIC PANEL
ALT: 13 IU/L (ref 0–32)
AST: 11 IU/L (ref 0–40)
Albumin/Globulin Ratio: 1.7 (ref 1.2–2.2)
Albumin: 4.3 g/dL (ref 3.9–4.9)
Alkaline Phosphatase: 80 IU/L (ref 44–121)
BUN/Creatinine Ratio: 11 (ref 9–23)
BUN: 8 mg/dL (ref 6–24)
Bilirubin Total: 0.7 mg/dL (ref 0.0–1.2)
CO2: 23 mmol/L (ref 20–29)
Calcium: 9.8 mg/dL (ref 8.7–10.2)
Chloride: 98 mmol/L (ref 96–106)
Creatinine, Ser: 0.75 mg/dL (ref 0.57–1.00)
Globulin, Total: 2.6 g/dL (ref 1.5–4.5)
Glucose: 110 mg/dL — ABNORMAL HIGH (ref 70–99)
Potassium: 3.9 mmol/L (ref 3.5–5.2)
Sodium: 138 mmol/L (ref 134–144)
Total Protein: 6.9 g/dL (ref 6.0–8.5)
eGFR: 101 mL/min/{1.73_m2} (ref >59)

## 2022-01-13 LAB — TSH+T4F+T3FREE
Free T4: 1.53 ng/dL (ref 0.82–1.77)
T3, Free: 3.5 pg/mL (ref 2.0–4.4)
TSH: 3.02 u[IU]/mL (ref 0.450–4.500)

## 2022-01-13 LAB — LIPID PANEL WITH LDL/HDL RATIO
Cholesterol, Total: 170 mg/dL (ref 100–199)
HDL: 48 mg/dL (ref >39)
LDL Chol Calc (NIH): 99 mg/dL (ref 0–99)
LDL/HDL Ratio: 2.1 ratio (ref 0.0–3.2)
Triglycerides: 131 mg/dL (ref 0–149)
VLDL Cholesterol Cal: 23 mg/dL (ref 5–40)

## 2022-01-13 LAB — VITAMIN B12: Vitamin B-12: 357 pg/mL (ref 232–1245)

## 2022-01-13 LAB — HEMOGLOBIN A1C
Est. average glucose Bld gHb Est-mCnc: 143 mg/dL
Hgb A1c MFr Bld: 6.6 % — ABNORMAL HIGH (ref 4.8–5.6)

## 2022-01-13 LAB — INSULIN, RANDOM: INSULIN: 23.1 u[IU]/mL (ref 2.6–24.9)

## 2022-01-13 LAB — VITAMIN D 25 HYDROXY (VIT D DEFICIENCY, FRACTURES): Vit D, 25-Hydroxy: 21.3 ng/mL — ABNORMAL LOW (ref 30.0–100.0)

## 2022-01-19 NOTE — Progress Notes (Unsigned)
Chief Complaint:   OBESITY Kendra Bennett (MR# [id number]) is a 41 y.o. female who presents for evaluation and treatment of obesity and related comorbidities. Current BMI is Body mass index is 35.33 kg/m. Kendra Bennett has been struggling with her weight for many years and has been unsuccessful in either losing weight, maintaining weight loss, or reaching her healthy weight goal.  Kendra Bennett is here for and initial visit. She likes to cook but she notes time as an obstacle.   Kendra Bennett is currently in the action stage of change and ready to dedicate time achieving and maintaining a healthier weight. Kendra Bennett is interested in becoming our patient and working on intensive lifestyle modifications including (but not limited to) diet and exercise for weight loss.  Kendra Bennett's habits were reviewed today and are as follows: Her family eats meals together, she thinks her family will eat healthier with her, she struggles with family and or coworkers weight loss sabotage, her desired weight loss is 37-47 lbs, she has been heavy most of her life, she started gaining weight at 41 years old, her heaviest weight ever was 188 pounds, she has significant food cravings issues, she snacks frequently in the evenings, she wakes up frequently in the middle of the night to eat, she skips meals frequently, she is frequently drinking liquids with calories, she frequently makes poor food choices, she has problems with excessive hunger, she frequently eats larger portions than normal, and she struggles with emotional eating.  Depression Screen Kendra Bennett's Food and Mood (modified PHQ-9) score was 6.     01/12/2022    7:27 AM  Depression screen PHQ 2/9  Decreased Interest 3  Down, Depressed, Hopeless 0  PHQ - 2 Score 3  Altered sleeping 0  Tired, decreased energy 3  Change in appetite 0  Feeling bad or failure about yourself  0  Trouble concentrating 0  Moving slowly or fidgety/restless 0  Suicidal thoughts 0  PHQ-9  Score 6  Difficult doing work/chores Not difficult at all   Subjective:   1. Other fatigue Kendra Bennett admits to daytime somnolence and admits to waking up still tired. Patient has a history of symptoms of daytime fatigue and morning fatigue. Kendra Bennett generally gets 4 or 5 hours of sleep per night, and states that she has nightime awakenings. Snoring is present. Apneic episodes are not present. Epworth Sleepiness Score is 14.   2. SOB (shortness of breath) on exertion Kendra Bennett notes increasing shortness of breath with exercising and seems to be worsening over time with weight gain. She notes getting out of breath sooner with activity than she used to. This has not gotten worse recently. Kendra Bennett denies shortness of breath at rest or orthopnea.  3. PCOS (polycystic ovarian syndrome) Kendra Bennett is taking birth control pills currently. She has tried metformin but had diarrhea, nausea, and vomiting.   4. Essential hypertension Kendra Bennett is taking olmesartan, and her blood pressure is controlled.   5. Diabetes mellitus type 2 in obese (HCC) Kendra Bennett is taking Trulicity. Her last A1c was 7.1.   6. Health care maintenance Given obesity.   7. B12 deficiency Kendra Bennett is not on B12 supplements.   8. Vitamin D deficiency Kendra Bennett is not on Vitamin D supplements.  Assessment/Plan:   1. Other fatigue Kendra Bennett does feel that her weight is causing her energy to be lower than it should be. Fatigue may be related to obesity, depression or many other causes. Labs will be ordered, and in the meanwhile, Kendra Bennett will focus on self  care including making healthy food choices, increasing physical activity and focusing on stress reduction.  - EKG 12-Lead - Comprehensive metabolic panel - Hemoglobin A1c - Lipid Panel With LDL/HDL Ratio - TSH+T4F+T3Free  2. SOB (shortness of breath) on exertion Kendra Bennett does feel that she gets out of breath more easily that she used to when she exercises. Kendra Bennett's  shortness of breath appears to be obesity related and exercise induced. She has agreed to work on weight loss and gradually increase exercise to treat her exercise induced shortness of breath. Will continue to monitor closely.  - TSH+T4F+T3Free  3. PCOS (polycystic ovarian syndrome) We will check labs today. Kendra Bennett will continue her medications, and she will work on cutting carbohydrates.   - Comprehensive metabolic panel - Hemoglobin A1c  4. Essential hypertension Kendra Bennett will continue her medications as directed.   5. Diabetes mellitus type 2 in obese [name]) We will check labs today. Kendra Bennett will continue her medications, and she agreed to increase Trulicity from 0.75 mg to 1.5 mg once weekly with no refills.   - Hemoglobin A1c - Insulin, random - Lipid Panel With LDL/HDL Ratio  6. Health care maintenance We will check labs today. EKG and IC were done today and reviewed with the patient.   7. B12 deficiency We will check labs today, and we will follow-up at Kendra Bennett's next visit.  - Vitamin B12  8. Vitamin D deficiency We will check labs today, and we will follow-up at Kendra Bennett's next visit.  - VITAMIN D 25 Hydroxy (Vit-D Deficiency, Fractures)  9. Depression screening Kendra Bennett had a positive depression screening. Depression is commonly associated with obesity and often results in emotional eating behaviors. We will monitor this closely and work on CBT to help improve the non-hunger eating patterns. Referral to Psychology may be required if no improvement is seen as she continues in our clinic.  10. Obesity, Current BMI 35.4 Kendra Bennett is currently in the action stage of change and her goal is to continue with weight loss efforts. I recommend Kendra Bennett begin the structured treatment plan as follows:  She has agreed to the Category 2 Plan.  Meal planning and intentional eating were discussed. She will decrease her snacking at night. Reviewed labs with the patient from  09/05/2021, CMP, CBC, A1c, TSH.   Exercise goals: No exercise has been prescribed at this time.   Behavioral modification strategies: increasing lean protein intake, decreasing simple carbohydrates, increasing vegetables, increasing water intake, decreasing eating out, no skipping meals, meal planning and cooking strategies, keeping healthy foods in the home, and planning for success.  She was informed of the importance of frequent follow-up visits to maximize her success with intensive lifestyle modifications for her multiple health conditions. She was informed we would discuss her lab results at her next visit unless there is a critical issue that needs to be addressed sooner. Kendra Bennett agreed to keep her next visit at the agreed upon time to discuss these results.  Objective:   Blood pressure 113/75, pulse 70, temperature 97.6 F (36.4 C), height 5\' 1"  (1.549 m), weight 187 lb (84.8 kg), SpO2 99 %. Body mass index is 35.33 kg/m.  EKG: Normal sinus rhythm, rate 66 BPM.  Indirect Calorimeter completed today shows a VO2 of 273 and a REE of 1886.  Her calculated basal metabolic rate is thus her basal metabolic rate is better than expected.  General: Cooperative, alert, well developed, in no acute distress. HEENT: Conjunctivae and lids unremarkable. Cardiovascular: Regular rhythm.  Lungs: Normal  work of breathing. Neurologic: No focal deficits.   Lab Results  Component Value Date   CREATININE 0.75 01/12/2022   BUN 8 01/12/2022   NA 138 01/12/2022   K 3.9 01/12/2022   CL 98 01/12/2022   CO2 23 01/12/2022   Lab Results  Component Value Date   ALT 13 01/12/2022   AST 11 01/12/2022   ALKPHOS 80 01/12/2022   BILITOT 0.7 01/12/2022   Lab Results  Component Value Date   HGBA1C 6.6 (H) 01/12/2022   HGBA1C 7.1 (H) 09/05/2021   Lab Results  Component Value Date   INSULIN 23.1 01/12/2022   Lab Results  Component Value Date   TSH 3.020 01/12/2022   Lab Results  Component  Value Date   CHOL 170 01/12/2022   HDL 48 01/12/2022   LDLCALC 99 01/12/2022   TRIG 131 01/12/2022   Lab Results  Component Value Date   WBC 8.9 09/05/2021   HGB 14.6 09/05/2021   HCT 44.4 09/05/2021   MCV 84.1 09/05/2021   PLT 298 09/05/2021   No results found for: "IRON", "TIBC", "FERRITIN"  Attestation Statements:   Reviewed by clinician on day of visit: allergies, medications, problem list, medical history, surgical history, family history, social history, and previous encounter notes.   Wilhemena Durie, am acting as Location manager for [employer], DO.  I have reviewed the above documentation for accuracy and completeness, and I agree with the above. Jearld Lesch, DO

## 2022-01-20 ENCOUNTER — Encounter: Payer: Self-pay | Admitting: Bariatrics

## 2022-01-26 ENCOUNTER — Ambulatory Visit: Payer: BC Managed Care – PPO | Admitting: Bariatrics

## 2022-01-26 ENCOUNTER — Encounter: Payer: Self-pay | Admitting: Bariatrics

## 2022-01-26 VITALS — BP 110/74 | HR 85 | Temp 97.9°F | Ht 61.0 in | Wt 187.0 lb

## 2022-01-26 DIAGNOSIS — E559 Vitamin D deficiency, unspecified: Secondary | ICD-10-CM

## 2022-01-26 DIAGNOSIS — E669 Obesity, unspecified: Secondary | ICD-10-CM

## 2022-01-26 DIAGNOSIS — E1169 Type 2 diabetes mellitus with other specified complication: Secondary | ICD-10-CM

## 2022-01-26 DIAGNOSIS — Z6835 Body mass index (BMI) 35.0-35.9, adult: Secondary | ICD-10-CM | POA: Diagnosis not present

## 2022-01-26 DIAGNOSIS — Z7985 Long-term (current) use of injectable non-insulin antidiabetic drugs: Secondary | ICD-10-CM

## 2022-01-26 MED ORDER — VITAMIN D (ERGOCALCIFEROL) 1.25 MG (50000 UNIT) PO CAPS: 50000.0000 [IU] | ORAL_CAPSULE | ORAL | 0 refills | Status: DC

## 2022-01-27 NOTE — Progress Notes (Unsigned)
Chief Complaint:   OBESITY Kendra Bennett is here to discuss her progress with her obesity treatment plan along with follow-up of her obesity related diagnoses. Kendra Bennett is on the Category 2 Plan and states she is following her eating plan approximately 50% of the time. Kendra Bennett states she is doing 0 minutes 0 times per week.  Today's visit was #: 2 Starting weight: 187 lbs Starting date: 01/12/2022 Today's weight: 187 lbs Today's date: 01/26/2022 Total lbs lost to date: 0 Total lbs lost since last in-office visit: 0  Interim History: Kendra Bennett's weight remains the same since her first visit.  She states that the plan was hard for the first week.  Subjective:   1. Vitamin D insufficiency Kendra Bennett is not on Vitamin D currently.   2. Diabetes mellitus type 2 in obese ([place]) Kendra Bennett is taking Trulicity.  Her last A1c was 6.6 and insulin 23.1.  Assessment/Plan:   1. Vitamin D insufficiency Kendra Bennett agreed to start prescription Vitamin D 50,000 IU every week, with no refills. She will follow-up for routine testing of Vitamin D, at least 2-3 times per year to avoid over-replacement.  - Vitamin D, Ergocalciferol, (DRISDOL) 1.25 MG (50000 UNIT) CAPS capsule; Take 1 capsule (50,000 Units total) by mouth every 7 (seven) days.  Dispense: 5 capsule; Refill: 0  2. Diabetes mellitus type 2 in obese ([place]) Kendra Bennett will continue Trulicity, and she will work on decreasing all carbohydrates (sugar and starches). Additional lunch and dinner options were given.   3. Obesity, Current BMI 35.3 Kendra Bennett is currently in the action stage of change. As such, her goal is to continue with weight loss efforts. She has agreed to the Category 2 Plan and keeping a food journal and adhering to recommended goals of 1200 calories and 80 grams of protein daily.   She will adhere closely to the plan 80-90%.  Review labs with the patient from 01/12/2022, CMP, lipids, vitamin D, and B12.  She will switch to low  carbohydrate wraps and increase her water intake.  Exercise goals: No exercise has been prescribed at this time.  Behavioral modification strategies: increasing lean protein intake, decreasing simple carbohydrates, increasing vegetables, increasing water intake, decreasing eating out, no skipping meals, meal planning and cooking strategies, keeping healthy foods in the home, and planning for success.  Kendra Bennett has agreed to follow-up with our clinic in 2 weeks. She was informed of the importance of frequent follow-up visits to maximize her success with intensive lifestyle modifications for her multiple health conditions.   Objective:   Blood pressure 110/74, pulse 85, temperature 97.9 F (36.6 C), height 5\' 1"  (1.549 m), weight 187 lb (84.8 kg), SpO2 97 %. Body mass index is 35.33 kg/m.  General: Cooperative, alert, well developed, in no acute distress. HEENT: Conjunctivae and lids unremarkable. Cardiovascular: Regular rhythm.  Lungs: Normal work of breathing. Neurologic: No focal deficits.   Lab Results  Component Value Date   CREATININE 0.75 01/12/2022   BUN 8 01/12/2022   NA 138 01/12/2022   K 3.9 01/12/2022   CL 98 01/12/2022   CO2 23 01/12/2022   Lab Results  Component Value Date   ALT 13 01/12/2022   AST 11 01/12/2022   ALKPHOS 80 01/12/2022   BILITOT 0.7 01/12/2022   Lab Results  Component Value Date   HGBA1C 6.6 (H) 01/12/2022   HGBA1C 7.1 (H) 09/05/2021   Lab Results  Component Value Date   INSULIN 23.1 01/12/2022   Lab Results  Component Value Date  TSH 3.020 01/12/2022   Lab Results  Component Value Date   CHOL 170 01/12/2022   HDL 48 01/12/2022   LDLCALC 99 01/12/2022   TRIG 131 01/12/2022   Lab Results  Component Value Date   VD25OH 21.3 (L) 01/12/2022   Lab Results  Component Value Date   WBC 8.9 09/05/2021   HGB 14.6 09/05/2021   HCT 44.4 09/05/2021   MCV 84.1 09/05/2021   PLT 298 09/05/2021   No results found for: "IRON", "TIBC",  "FERRITIN"  Attestation Statements:   Reviewed by clinician on day of visit: allergies, medications, problem list, medical history, surgical history, family history, social history, and previous encounter notes.   Trude Mcburney, am acting as Energy manager for [employer], DO.  I have reviewed the above documentation for accuracy and completeness, and I agree with the above. Corinna Capra, DO

## 2022-01-28 ENCOUNTER — Encounter: Payer: Self-pay | Admitting: Bariatrics

## 2022-02-03 ENCOUNTER — Other Ambulatory Visit (INDEPENDENT_AMBULATORY_CARE_PROVIDER_SITE_OTHER): Payer: Self-pay | Admitting: Bariatrics

## 2022-02-03 ENCOUNTER — Other Ambulatory Visit: Payer: Self-pay | Admitting: Family

## 2022-02-03 DIAGNOSIS — E669 Obesity, unspecified: Secondary | ICD-10-CM

## 2022-02-03 MED ORDER — TRULICITY 1.5 MG/0.5ML ~~LOC~~ SOAJ: 1.5000 mg | SUBCUTANEOUS | 0 refills | Status: DC

## 2022-02-03 NOTE — Telephone Encounter (Signed)
Please advise on refill for pt.

## 2022-02-11 ENCOUNTER — Encounter: Payer: Self-pay | Admitting: Bariatrics

## 2022-02-11 ENCOUNTER — Ambulatory Visit: Payer: BC Managed Care – PPO | Admitting: Bariatrics

## 2022-02-11 VITALS — BP 119/76 | HR 70 | Temp 97.8°F | Ht 61.0 in | Wt 186.0 lb

## 2022-02-11 DIAGNOSIS — Z7985 Long-term (current) use of injectable non-insulin antidiabetic drugs: Secondary | ICD-10-CM

## 2022-02-11 DIAGNOSIS — E559 Vitamin D deficiency, unspecified: Secondary | ICD-10-CM | POA: Diagnosis not present

## 2022-02-11 DIAGNOSIS — E1169 Type 2 diabetes mellitus with other specified complication: Secondary | ICD-10-CM

## 2022-02-11 DIAGNOSIS — Z6835 Body mass index (BMI) 35.0-35.9, adult: Secondary | ICD-10-CM | POA: Diagnosis not present

## 2022-02-11 DIAGNOSIS — E669 Obesity, unspecified: Secondary | ICD-10-CM

## 2022-02-11 MED ORDER — TRULICITY 1.5 MG/0.5ML ~~LOC~~ SOAJ: 1.5000 mg | SUBCUTANEOUS | 0 refills | Status: DC

## 2022-02-11 MED ORDER — VITAMIN D (ERGOCALCIFEROL) 1.25 MG (50000 UNIT) PO CAPS: 50000.0000 [IU] | ORAL_CAPSULE | ORAL | 0 refills | Status: DC

## 2022-02-16 NOTE — Progress Notes (Signed)
Chief Complaint:   OBESITY Kendra Bennett is here to discuss her progress with her obesity treatment plan along with follow-up of her obesity related diagnoses. Kendra Bennett is on the Category 2 Plan and keeping a food journal and adhering to recommended goals of 1200 calories and 80 grams of protein daily and states she is following her eating plan approximately 75% of the time. Kendra Bennett states she is doing 0 minutes 0 times per week.  Today's visit was #: 3 Starting weight: 187 lbs Starting date: 01/12/2022 Today's weight: 186 lbs Today's date: 02/11/2022 Total lbs lost to date: 1 Total lbs lost since last in-office visit: 1  Interim History: Kendra Bennett is down 1 pound since her last visit.  She is getting better with the water and protein.  She states that she is making better choices.  Subjective:   1. Diabetes mellitus type 2 in obese [facility]) Kendra Bennett has no issues with her medications.  2. Vitamin D insufficiency Kendra Bennett notes minimal sun exposure.  Assessment/Plan:   1. Diabetes mellitus type 2 in obese ([place]) Kendra Bennett will continue Trulicity at 1.5 mg once weekly, and we will refill for 1 month. Shake sheet was given today.  - Dulaglutide (TRULICITY) 1.5 EH/2.0NO SOPN; Inject 1.5 mg into the skin once a week.  Dispense: 2 mL; Refill: 0  2. Vitamin D insufficiency We will refill prescription Vitamin D for 1 month. Kendra Bennett will follow-up for routine testing of Vitamin D, at least 2-3 times per year to avoid over-replacement.  - Vitamin D, Ergocalciferol, (DRISDOL) 1.25 MG (50000 UNIT) CAPS capsule; Take 1 capsule (50,000 Units total) by mouth every 7 (seven) days.  Dispense: 5 capsule; Refill: 0  3. Obesity, Current BMI 35.2 Kendra Bennett is currently in the action stage of change. As such, her goal is to continue with weight loss efforts. She has agreed to the Category 2 Plan and keeping a food journal and adhering to recommended goals of 1200 calories and 80 grams of protein.    Meal planning was discussed.  She will adhere closely to the plan 85-95%.  Keep water and protein intake up.  Eating out handout was given.  Exercise goals: No exercise has been prescribed at this time.  Behavioral modification strategies: increasing lean protein intake, decreasing simple carbohydrates, increasing vegetables, increasing water intake, decreasing eating out, no skipping meals, meal planning and cooking strategies, keeping healthy foods in the home, and planning for success.  Kendra Bennett has agreed to follow-up with our clinic in 2 weeks with Everardo Pacific, FNP-C. She was informed of the importance of frequent follow-up visits to maximize her success with intensive lifestyle modifications for her multiple health conditions.   Objective:   Blood pressure 119/76, pulse 70, temperature 97.8 F (36.6 C), height 5\' 1"  (1.549 m), weight 186 lb (84.4 kg), SpO2 97 %. Body mass index is 35.14 kg/m.  General: Cooperative, alert, well developed, in no acute distress. HEENT: Conjunctivae and lids unremarkable. Cardiovascular: Regular rhythm.  Lungs: Normal work of breathing. Neurologic: No focal deficits.   Lab Results  Component Value Date   CREATININE 0.75 01/12/2022   BUN 8 01/12/2022   NA 138 01/12/2022   K 3.9 01/12/2022   CL 98 01/12/2022   CO2 23 01/12/2022   Lab Results  Component Value Date   ALT 13 01/12/2022   AST 11 01/12/2022   ALKPHOS 80 01/12/2022   BILITOT 0.7 01/12/2022   Lab Results  Component Value Date   HGBA1C 6.6 (H) 01/12/2022  HGBA1C 7.1 (H) 09/05/2021   Lab Results  Component Value Date   INSULIN 23.1 01/12/2022   Lab Results  Component Value Date   TSH 3.020 01/12/2022   Lab Results  Component Value Date   CHOL 170 01/12/2022   HDL 48 01/12/2022   LDLCALC 99 01/12/2022   TRIG 131 01/12/2022   Lab Results  Component Value Date   VD25OH 21.3 (L) 01/12/2022   Lab Results  Component Value Date   WBC 8.9 09/05/2021   HGB  14.6 09/05/2021   HCT 44.4 09/05/2021   MCV 84.1 09/05/2021   PLT 298 09/05/2021   No results found for: "IRON", "TIBC", "FERRITIN"  Attestation Statements:   Reviewed by clinician on day of visit: allergies, medications, problem list, medical history, surgical history, family history, social history, and previous encounter notes.   Trude Mcburney, am acting as Energy manager for [employer], DO.  I have reviewed the above documentation for accuracy and completeness, and I agree with the above. Corinna Capra, DO

## 2022-02-17 ENCOUNTER — Encounter: Payer: Self-pay | Admitting: Bariatrics

## 2022-02-24 ENCOUNTER — Telehealth: Payer: Self-pay

## 2022-02-24 ENCOUNTER — Ambulatory Visit: Payer: BC Managed Care – PPO | Admitting: Family Medicine

## 2022-02-24 DIAGNOSIS — M25571 Pain in right ankle and joints of right foot: Secondary | ICD-10-CM | POA: Diagnosis not present

## 2022-02-24 NOTE — Telephone Encounter (Signed)
LVM for pt to call office back.  If pt calls back, please find out when pt fell and had injury. If it was around a week, please make sure pt is aware that we do not have xray in office. If pt is okay with keeping appt she can. If pt fell in the last 24-48 hrs, she will need to be sent to triage, she may need to consider an urgent care or ED as may not be equipped to provide treatment. Pt needs to be notified that we do not have xray in our office if that is something she is looking to have done in her appt today.

## 2022-02-26 ENCOUNTER — Ambulatory Visit: Payer: BC Managed Care – PPO | Admitting: Nurse Practitioner

## 2022-03-20 DIAGNOSIS — R059 Cough, unspecified: Secondary | ICD-10-CM | POA: Diagnosis not present

## 2022-03-20 DIAGNOSIS — J101 Influenza due to other identified influenza virus with other respiratory manifestations: Secondary | ICD-10-CM | POA: Diagnosis not present

## 2022-04-10 DIAGNOSIS — Z20822 Contact with and (suspected) exposure to covid-19: Secondary | ICD-10-CM | POA: Diagnosis not present

## 2022-04-10 DIAGNOSIS — J101 Influenza due to other identified influenza virus with other respiratory manifestations: Secondary | ICD-10-CM | POA: Diagnosis not present

## 2022-04-10 DIAGNOSIS — R059 Cough, unspecified: Secondary | ICD-10-CM | POA: Diagnosis not present

## 2022-04-22 ENCOUNTER — Other Ambulatory Visit: Payer: Self-pay | Admitting: Family

## 2022-05-20 ENCOUNTER — Other Ambulatory Visit: Payer: Self-pay | Admitting: Family

## 2022-05-31 ENCOUNTER — Other Ambulatory Visit: Payer: Self-pay | Admitting: Family

## 2022-05-31 ENCOUNTER — Other Ambulatory Visit: Payer: Self-pay | Admitting: Bariatrics

## 2022-05-31 DIAGNOSIS — E1169 Type 2 diabetes mellitus with other specified complication: Secondary | ICD-10-CM

## 2022-06-01 ENCOUNTER — Encounter: Payer: Self-pay | Admitting: Family

## 2022-06-01 ENCOUNTER — Telehealth: Payer: Self-pay | Admitting: Family

## 2022-06-01 NOTE — Telephone Encounter (Signed)
Our office has never written her OCPs- she has only been seen for one visit in May 2023. I am not sure who is managing her GYN needs but it has not been our office.

## 2022-06-02 ENCOUNTER — Other Ambulatory Visit: Payer: Self-pay | Admitting: Family

## 2022-06-02 ENCOUNTER — Telehealth: Payer: Self-pay | Admitting: Family

## 2022-06-02 ENCOUNTER — Encounter: Payer: Self-pay | Admitting: Family

## 2022-06-02 DIAGNOSIS — Z3009 Encounter for other general counseling and advice on contraception: Secondary | ICD-10-CM

## 2022-06-02 DIAGNOSIS — E282 Polycystic ovarian syndrome: Secondary | ICD-10-CM

## 2022-06-02 NOTE — Telephone Encounter (Signed)
Can you please call this patient's pharmacy? Who has been writing her birth control RX for her? I have never written it- she keeps asking for refills; Last OV and only OV here was May 2023. Thanks-

## 2022-06-02 NOTE — Telephone Encounter (Signed)
Spoke with patient's pharmacy- last refill was in October 2023 by Dr. Jonathon Jordan. Patient notified that we have never written for her and would need an OV to discuss refills since she has only been seen here once in May 2023.

## 2022-06-03 ENCOUNTER — Ambulatory Visit: Payer: BC Managed Care – PPO | Admitting: Nurse Practitioner

## 2022-06-03 ENCOUNTER — Encounter: Payer: Self-pay | Admitting: Nurse Practitioner

## 2022-06-03 VITALS — BP 115/77 | HR 85 | Temp 98.4°F | Ht 61.0 in | Wt 183.0 lb

## 2022-06-03 DIAGNOSIS — E559 Vitamin D deficiency, unspecified: Secondary | ICD-10-CM | POA: Diagnosis not present

## 2022-06-03 DIAGNOSIS — E1169 Type 2 diabetes mellitus with other specified complication: Secondary | ICD-10-CM | POA: Diagnosis not present

## 2022-06-03 DIAGNOSIS — E669 Obesity, unspecified: Secondary | ICD-10-CM

## 2022-06-03 DIAGNOSIS — Z7985 Long-term (current) use of injectable non-insulin antidiabetic drugs: Secondary | ICD-10-CM

## 2022-06-03 DIAGNOSIS — Z6834 Body mass index (BMI) 34.0-34.9, adult: Secondary | ICD-10-CM | POA: Diagnosis not present

## 2022-06-03 DIAGNOSIS — K760 Fatty (change of) liver, not elsewhere classified: Secondary | ICD-10-CM | POA: Insufficient documentation

## 2022-06-03 MED ORDER — TIRZEPATIDE 2.5 MG/0.5ML ~~LOC~~ SOAJ: 2.5000 mg | SUBCUTANEOUS | 0 refills | Status: DC

## 2022-06-03 NOTE — Patient Instructions (Signed)

## 2022-06-03 NOTE — Progress Notes (Signed)
Office: [phone]  /  Fax: [phone]  WEIGHT SUMMARY AND BIOMETRICS  Medical Weight Loss Height: 5\' 1"  (1.549 m) Weight: 183 lb (83 kg) Temp: 98.4 F (36.9 C) Pulse Rate: 85 BP: 115/77 SpO2: 98 % Today's Visit #: 4 Weight at Last VIsit: 186lb Weight Lost Since Last Visit: -3  Body Fat %: 42.3 % Fat Mass (lbs): 77.4 lbs Muscle Mass (lbs): 100.4 lbs Total Body Water (lbs): 76.4 lbs Visceral Fat Rating : 10 Starting Date: 01/12/22 Starting Weight: 187lb Total Weight Loss (lbs): 4 lb (1.814 kg)    HPI  Chief Complaint: OBESITY  Kendra Bennett is here to discuss her progress with her obesity treatment plan. She is on the the Category 2 Plan and states she is following her eating plan approximately 25 % of the time. She states she is exercising 0 minutes 0 times per week.  Interval History:  She was seen here last on 02/11/22.  Since last office visit she has lost 3 pounds. She has had the flu A, flu B and a "stomach bug" since her last visit.  She has been starting to feel better after the new year.  She is not trying to eat more protein.  She was taking Trulicity 1.5mg  and stopped for a month while she was sick.  She restarted taking it 2 weeks ago.  Since took one injection 2 weeks ago and hasn't taken another because she wanted to see if she could control her sugar without it.  She took Metformin in the past and stopped due to diarrhea.  She tried [facility] and stopped due to cost. She felt the [facility] worked better by controlling her appetite and cravings.   She doesn't check her BS at home.  Last week felt sweaty "like a low sugar".  She notes if she is busy at work she won't eat until the end of the day.  She is skipping meals. She notes she eats " a lot of meat".  She is drinking water, unsweetened tea.  Once tried a protein shake and had some GI upset and diarrhea.    Pharmacotherapy: Not currently on medications.   Past medications:  never been on weight loss medications  in the past.   PHYSICAL EXAM:  Blood pressure 115/77, pulse 85, temperature 98.4 F (36.9 C), height 5\' 1"  (1.549 m), weight 183 lb (83 kg), last menstrual period 05/20/2022, SpO2 98 %. Body mass index is 34.58 kg/m.  General: She is overweight, cooperative, alert, well developed, and in no acute distress. PSYCH: Has normal mood, affect and thought process.   Extremities: No edema.  Neurologic: No gross sensory or motor deficits. No tremors or fasciculations noted.    DIAGNOSTIC DATA REVIEWED:  BMET    Component Value Date/Time   NA 138 01/12/2022 0856   K 3.9 01/12/2022 0856   CL 98 01/12/2022 0856   CO2 23 01/12/2022 0856   GLUCOSE 110 (H) 01/12/2022 0856   GLUCOSE 111 (H) 09/05/2021 1453   BUN 8 01/12/2022 0856   CREATININE 0.75 01/12/2022 0856   CREATININE 0.61 09/05/2021 1453   CALCIUM 9.8 01/12/2022 0856   GFRNONAA >60 08/04/2015 1325   GFRAA >60 08/04/2015 1325   Lab Results  Component Value Date   HGBA1C 6.6 (H) 01/12/2022   HGBA1C 7.1 (H) 09/05/2021   Lab Results  Component Value Date   INSULIN 23.1 01/12/2022   Lab Results  Component Value Date   TSH 3.020 01/12/2022   CBC  Component Value Date/Time   WBC 8.9 09/05/2021 1453   RBC 5.28 (H) 09/05/2021 1453   HGB 14.6 09/05/2021 1453   HCT 44.4 09/05/2021 1453   PLT 298 09/05/2021 1453   MCV 84.1 09/05/2021 1453   MCH 27.7 09/05/2021 1453   MCHC 32.9 09/05/2021 1453   RDW 12.8 09/05/2021 1453   Iron Studies No results found for: "IRON", "TIBC", "FERRITIN", "IRONPCTSAT" Lipid Panel     Component Value Date/Time   CHOL 170 01/12/2022 0856   TRIG 131 01/12/2022 0856   HDL 48 01/12/2022 0856   LDLCALC 99 01/12/2022 0856   Hepatic Function Panel     Component Value Date/Time   PROT 6.9 01/12/2022 0856   ALBUMIN 4.3 01/12/2022 0856   AST 11 01/12/2022 0856   ALT 13 01/12/2022 0856   ALKPHOS 80 01/12/2022 0856   BILITOT 0.7 01/12/2022 0856      Component Value Date/Time   TSH 3.020  01/12/2022 0856   Nutritional Lab Results  Component Value Date   VD25OH 21.3 (L) 01/12/2022     ASSESSMENT AND PLAN  TREATMENT PLAN FOR OBESITY:  Recommended Dietary Goals  Kendra Bennett is currently in the action stage of change. As such, her goal is to continue weight management plan. She has agreed to the Category 2 Plan.  Behavioral Intervention  We discussed the following Behavioral Modification Strategies today: increasing lean protein intake, increasing vegetables, and increase water intake.  Additional resources provided today: NA  Recommended Physical Activity Goals  Kendra Bennett has been advised to work up to 150 minutes of moderate intensity aerobic activity a week and strengthening exercises 2-3 times per week for cardiovascular health, weight loss maintenance and preservation of muscle mass.   She has agreed to Patient also encouraged on scheduling and tracking physical activity.    Pharmacotherapy We discussed various medication options to help Kendra Bennett with her weight loss efforts and we both agreed to to start [facility] 2.5mg  for DMT2.  Consider Ozempic if needed based upon coverage.  ASSOCIATED CONDITIONS ADDRESSED TODAY  Diabetes mellitus type 2 in obese [facility]) See above note.  Will stop Trulicity.  Start Mounjaro 2.5mg .    -     Comprehensive metabolic panel -     Hemoglobin A1c -     Tirzepatide; Inject 2.5 mg into the skin once a week.  Dispense: 2 mL; Refill: 0.  Side effects discussed.  Contraindications: patient denies.  Pancreatitis (active gallstones) Medullary thyroid cancer High triglycerides (>500)-will need labs prior to starting Multiple Endocrine Neoplasia syndrome type 2 (MEN 2) Trying to get pregnant Breastfeeding Use with caution with taking insulin or sulfonylureas (will need to monitor blood sugars for hypoglycemia)    Vitamin D insufficiency She is taking Vit D OTC, unsure of dose.  Takes maybe 3-4 times per week.  Took Vit D 50,000 IU  weekly after last labs.   -     VITAMIN D 25 Hydroxy (Vit-D Deficiency, Fractures)  Generalized obesity  BMI 34.0-34.9,adult     Return in about 3 weeks (around 06/24/2022).Marland Kitchen She was informed of the importance of frequent follow up visits to maximize her success with intensive lifestyle modifications for her multiple health conditions.   ATTESTASTION STATEMENTS:  Reviewed by clinician on day of visit: allergies, medications, problem list, medical history, surgical history, family history, social history, and previous encounter notes.   Time spent on visit including pre-visit chart review and post-visit care and charting was 30 minutes.    Ailene Rud. Irven Ingalsbe  FNP-C

## 2022-06-04 LAB — COMPREHENSIVE METABOLIC PANEL
ALT: 8 IU/L (ref 0–32)
AST: 11 IU/L (ref 0–40)
Albumin/Globulin Ratio: 1.6 (ref 1.2–2.2)
Albumin: 4.2 g/dL (ref 3.9–4.9)
Alkaline Phosphatase: 72 IU/L (ref 44–121)
BUN/Creatinine Ratio: 13 (ref 9–23)
BUN: 7 mg/dL (ref 6–24)
Bilirubin Total: 0.4 mg/dL (ref 0.0–1.2)
CO2: 19 mmol/L — ABNORMAL LOW (ref 20–29)
Calcium: 9.4 mg/dL (ref 8.7–10.2)
Chloride: 105 mmol/L (ref 96–106)
Creatinine, Ser: 0.56 mg/dL — ABNORMAL LOW (ref 0.57–1.00)
Globulin, Total: 2.7 g/dL (ref 1.5–4.5)
Glucose: 92 mg/dL (ref 70–99)
Potassium: 4 mmol/L (ref 3.5–5.2)
Sodium: 142 mmol/L (ref 134–144)
Total Protein: 6.9 g/dL (ref 6.0–8.5)
eGFR: 116 mL/min/{1.73_m2} (ref >59)

## 2022-06-04 LAB — HEMOGLOBIN A1C
Est. average glucose Bld gHb Est-mCnc: 151 mg/dL
Hgb A1c MFr Bld: 6.9 % — ABNORMAL HIGH (ref 4.8–5.6)

## 2022-06-04 LAB — VITAMIN D 25 HYDROXY (VIT D DEFICIENCY, FRACTURES): Vit D, 25-Hydroxy: 40.8 ng/mL (ref 30.0–100.0)

## 2022-06-08 ENCOUNTER — Encounter: Payer: Self-pay | Admitting: General Practice

## 2022-07-01 ENCOUNTER — Encounter: Payer: Self-pay | Admitting: Nurse Practitioner

## 2022-07-01 ENCOUNTER — Ambulatory Visit: Payer: BC Managed Care – PPO | Admitting: Nurse Practitioner

## 2022-07-01 VITALS — BP 121/85 | HR 68 | Temp 98.3°F | Ht 61.0 in | Wt 185.0 lb

## 2022-07-01 DIAGNOSIS — E119 Type 2 diabetes mellitus without complications: Secondary | ICD-10-CM

## 2022-07-01 DIAGNOSIS — E1169 Type 2 diabetes mellitus with other specified complication: Secondary | ICD-10-CM | POA: Diagnosis not present

## 2022-07-01 DIAGNOSIS — E669 Obesity, unspecified: Secondary | ICD-10-CM | POA: Diagnosis not present

## 2022-07-01 DIAGNOSIS — E559 Vitamin D deficiency, unspecified: Secondary | ICD-10-CM | POA: Diagnosis not present

## 2022-07-01 DIAGNOSIS — Z7985 Long-term (current) use of injectable non-insulin antidiabetic drugs: Secondary | ICD-10-CM

## 2022-07-01 DIAGNOSIS — Z6834 Body mass index (BMI) 34.0-34.9, adult: Secondary | ICD-10-CM

## 2022-07-01 MED ORDER — TIRZEPATIDE 5 MG/0.5ML ~~LOC~~ SOAJ: 5.0000 mg | SUBCUTANEOUS | 0 refills | Status: DC

## 2022-07-01 NOTE — Progress Notes (Addendum)
Office: [phone]  /  Fax: [phone]  WEIGHT SUMMARY AND BIOMETRICS  Weight Lost Since Last Visit: 0lb  No data recorded  Vitals Temp: 98.3 F (36.8 C) BP: 121/85 Pulse Rate: 68 SpO2: 97 %   Anthropometric Measurements Height: '5\' 1"'$  (1.549 m) Weight: 185 lb (83.9 kg) BMI (Calculated): 34.97 Weight at Last Visit: 183lb Weight Lost Since Last Visit: 0lb Starting Weight: 187lb Total Weight Loss (lbs): 2 lb (0.907 kg)   Body Composition  Body Fat %: 42.3 % Fat Mass (lbs): 78.6 lbs Muscle Mass (lbs): 101.6 lbs Total Body Water (lbs): 77.6 lbs Visceral Fat Rating : 10   Other Clinical Data Today's Visit #: 5 Starting Date: 01/12/22   HPI  Chief Complaint: OBESITY  Kendra Bennett is here to discuss her progress with her obesity treatment plan. Kendra Bennett is on the the Category 2 Plan and states Kendra Bennett is following her eating plan approximately 80 % of the time. Kendra Bennett states Kendra Bennett is exercising 20 minutes 1 days per week.   Interval History:  Since last office visit Kendra Bennett gained 2 pounds.  Kendra Bennett notes Kendra Bennett got distracted this week.  Kendra Bennett went to a birthday party and took home "a lot of food, it's a lot of carbs".  Kendra Bennett notes some polyphagia and cravings especially a couple days prior to when her injection is due.  Kendra Bennett feels that her clothes are fitting better.  Kendra Bennett is drinking protein shake, water, coffee, unsweetened tea daily.   Pharmacotherapy for weight loss: Kendra Bennett is not currently taking medications  for medical weight loss.    Previous pharmacotherapy for medical weight loss:   None.     Bariatric surgery:  never had surgery.   Pharmacotherapy for DMT2:  Kendra Bennett is currently taking Mounjaro 2.'5mg'$ .  Denies side effects.   Last A1c was 6.9 Kendra Bennett is not checking BS at home.   Episodes of hypoglycemia: no On ACE or ARB, ASA '81mg'$  and statin.  Last eye exam:  2023 Kendra Bennett has tried Trulicity in the past.    Lab Results  Component Value Date   HGBA1C 6.9 (H) 06/03/2022   HGBA1C 6.6 (H)  01/12/2022   HGBA1C 7.1 (H) 09/05/2021   Lab Results  Component Value Date   LDLCALC 99 01/12/2022   CREATININE 0.56 (L) 06/03/2022   Vit D deficiency  Kendra Bennett is taking Vit D 5,000 IU daily.  Denies side effects.  Denies nausea, vomiting or muscle weakness.    Lab Results  Component Value Date   VD25OH 40.8 06/03/2022   VD25OH 21.3 (L) 01/12/2022     PHYSICAL EXAM:  Blood pressure 121/85, pulse 68, temperature 98.3 F (36.8 C), height '5\' 1"'$  (1.549 m), weight 185 lb (83.9 kg), last menstrual period 05/20/2022, SpO2 97 %. Body mass index is 34.96 kg/m.  General: Kendra Bennett is overweight, cooperative, alert, well developed, and in no acute distress. PSYCH: Has normal mood, affect and thought process.   Extremities: No edema.  Neurologic: No gross sensory or motor deficits. No tremors or fasciculations noted.    DIAGNOSTIC DATA REVIEWED:  BMET    Component Value Date/Time   NA 142 06/03/2022 1600   K 4.0 06/03/2022 1600   CL 105 06/03/2022 1600   CO2 19 (L) 06/03/2022 1600   GLUCOSE 92 06/03/2022 1600   GLUCOSE 111 (H) 09/05/2021 1453   BUN 7 06/03/2022 1600   CREATININE 0.56 (L) 06/03/2022 1600   CREATININE 0.61 09/05/2021 1453   CALCIUM 9.4 06/03/2022 1600   GFRNONAA >60  08/04/2015 1325   GFRAA >60 08/04/2015 1325   Lab Results  Component Value Date   HGBA1C 6.9 (H) 06/03/2022   HGBA1C 7.1 (H) 09/05/2021   Lab Results  Component Value Date   INSULIN 23.1 01/12/2022   Lab Results  Component Value Date   TSH 3.020 01/12/2022   CBC    Component Value Date/Time   WBC 8.9 09/05/2021 1453   RBC 5.28 (H) 09/05/2021 1453   HGB 14.6 09/05/2021 1453   HCT 44.4 09/05/2021 1453   PLT 298 09/05/2021 1453   MCV 84.1 09/05/2021 1453   MCH 27.7 09/05/2021 1453   MCHC 32.9 09/05/2021 1453   RDW 12.8 09/05/2021 1453   Iron Studies No results found for: "IRON", "TIBC", "FERRITIN", "IRONPCTSAT" Lipid Panel     Component Value Date/Time   CHOL 170 01/12/2022 0856   TRIG  131 01/12/2022 0856   HDL 48 01/12/2022 0856   LDLCALC 99 01/12/2022 0856   Hepatic Function Panel     Component Value Date/Time   PROT 6.9 06/03/2022 1600   ALBUMIN 4.2 06/03/2022 1600   AST 11 06/03/2022 1600   ALT 8 06/03/2022 1600   ALKPHOS 72 06/03/2022 1600   BILITOT 0.4 06/03/2022 1600      Component Value Date/Time   TSH 3.020 01/12/2022 0856   Nutritional Lab Results  Component Value Date   VD25OH 40.8 06/03/2022   VD25OH 21.3 (L) 01/12/2022     ASSESSMENT AND PLAN  TREATMENT PLAN FOR OBESITY:  Recommended Dietary Goals  Kendra Bennett is currently in the action stage of change. As such, her goal is to continue weight management plan. Kendra Bennett has agreed to the Category 2 Plan.  Behavioral Intervention  We discussed the following Behavioral Modification Strategies today: increasing lean protein intake, decreasing simple carbohydrates , increasing vegetables, avoiding skipping meals, increasing water intake, and work on meal planning and easy cooking plans.  Additional resources provided today: NA  Recommended Physical Activity Goals  Kendra Bennett has been advised to work up to 150 minutes of moderate intensity aerobic activity a week and strengthening exercises 2-3 times per week for cardiovascular health, weight loss maintenance and preservation of muscle mass.   Kendra Bennett has agreed to increase physical activity in their day and reduce sedentary time (increase NEAT).  and Patient also encouraged on scheduling and tracking physical activity.    Pharmacotherapy We discussed various medication options to help Kendra Bennett with her weight loss efforts and we both agreed to increase Mounjaro to '5mg'$ .  ASSOCIATED CONDITIONS ADDRESSED TODAY  Action/Plan  Diabetes mellitus type 2 in obese ([place]) -     Tirzepatide; Inject 5 mg into the skin once a week.  Dispense: 2 mL; Refill: 0  Good blood sugar control is important to decrease the likelihood of diabetic complications such as  nephropathy, neuropathy, limb loss, blindness, coronary artery disease, and death. Intensive lifestyle modification including diet, exercise and weight loss are the first line of treatment for diabetes.    Vitamin D insufficiency Continue Vit D as directed.   Generalized obesity  BMI 34.0-34.9,adult      Labs reviewed in chart with patient.    Return in about 4 weeks (around 07/29/2022).Marland Kitchen Kendra Bennett was informed of the importance of frequent follow up visits to maximize her success with intensive lifestyle modifications for her multiple health conditions.   ATTESTASTION STATEMENTS:  Reviewed by clinician on day of visit: allergies, medications, problem list, medical history, surgical history, family history, social history, and previous encounter notes.  Ailene Rud. Sheniqua Carolan FNP-C

## 2022-07-01 NOTE — Patient Instructions (Signed)

## 2022-07-08 ENCOUNTER — Encounter: Payer: Self-pay | Admitting: Obstetrics and Gynecology

## 2022-07-17 ENCOUNTER — Encounter: Payer: Self-pay | Admitting: Obstetrics and Gynecology

## 2022-07-17 ENCOUNTER — Encounter: Payer: Self-pay | Admitting: Family

## 2022-07-27 ENCOUNTER — Encounter: Payer: BC Managed Care – PPO | Admitting: Obstetrics and Gynecology

## 2022-07-27 ENCOUNTER — Ambulatory Visit: Payer: BC Managed Care – PPO | Admitting: Nurse Practitioner

## 2022-07-30 ENCOUNTER — Encounter: Payer: Self-pay | Admitting: Nurse Practitioner

## 2022-07-30 ENCOUNTER — Ambulatory Visit: Payer: BC Managed Care – PPO | Admitting: Nurse Practitioner

## 2022-07-30 VITALS — BP 106/71 | HR 64 | Temp 98.0°F | Ht 61.0 in | Wt 183.0 lb

## 2022-07-30 DIAGNOSIS — E669 Obesity, unspecified: Secondary | ICD-10-CM

## 2022-07-30 DIAGNOSIS — Z7985 Long-term (current) use of injectable non-insulin antidiabetic drugs: Secondary | ICD-10-CM

## 2022-07-30 DIAGNOSIS — Z6834 Body mass index (BMI) 34.0-34.9, adult: Secondary | ICD-10-CM | POA: Diagnosis not present

## 2022-07-30 DIAGNOSIS — E1169 Type 2 diabetes mellitus with other specified complication: Secondary | ICD-10-CM | POA: Diagnosis not present

## 2022-07-30 MED ORDER — TIRZEPATIDE 5 MG/0.5ML ~~LOC~~ SOAJ: 5.0000 mg | SUBCUTANEOUS | 0 refills | Status: DC

## 2022-07-30 NOTE — Progress Notes (Signed)
Office: [phone]  /  Fax: [phone]  WEIGHT SUMMARY AND BIOMETRICS  Weight Lost Since Last Visit: 2lb  No data recorded  Vitals Temp: 98 F (36.7 C) BP: 106/71 Pulse Rate: 64 SpO2: 97 %   Anthropometric Measurements Height: 5\' 1"  (1.549 m) Weight: 183 lb (83 kg) BMI (Calculated): 34.6 Weight at Last Visit: 185lb Weight Lost Since Last Visit: 2lb Starting Weight: 187lb Total Weight Loss (lbs): 4 lb (1.814 kg)   Body Composition  Body Fat %: 40.5 % Fat Mass (lbs): 74.4 lbs Muscle Mass (lbs): 103.6 lbs Total Body Water (lbs): 74.2 lbs Visceral Fat Rating : 10   Other Clinical Data Fasting: yes Labs: no Today's Visit #: 6 Starting Date: 01/12/22     HPI  Chief Complaint: OBESITY  Kendra Bennett is here to discuss her progress with her obesity treatment plan. She is on the the Category 2 Plan and states she is following her eating plan approximately 70 % of the time. She states she is exercising 30 minutes 2-3 days per week.   Interval History:  Since last office visit she lost 2 pounds.  She does well 2 weeks after her visits and then seems to get off track 2 weeks prior to her next visit. She has been eating more carbs.   She is drinking protein shake, water, coffee, unsweetened tea daily.    Pharmacotherapy for weight loss: She is not currently taking medications  for medical weight loss.    Previous pharmacotherapy for medical weight loss:  None  Bariatric surgery:  Patient never had bariatric surgery.   Pharmacotherapy for DMT2:  She is currently taking Mounjaro 5mg .  Denies side effects.   Last A1c was 6.9 She is not checking BS at home.   On ARB  Last eye exam:  2023 She has tried Trulicity in the past.    Lab Results  Component Value Date   HGBA1C 6.9 (H) 06/03/2022   HGBA1C 6.6 (H) 01/12/2022   HGBA1C 7.1 (H) 09/05/2021   Lab Results  Component Value Date   LDLCALC 99 01/12/2022   CREATININE 0.56 (L) 06/03/2022     PHYSICAL  EXAM:  Blood pressure 106/71, pulse 64, temperature 98 F (36.7 C), height 5\' 1"  (1.549 m), weight 183 lb (83 kg), SpO2 97 %. Body mass index is 34.58 kg/m.  General: She is overweight, cooperative, alert, well developed, and in no acute distress. PSYCH: Has normal mood, affect and thought process.   Extremities: No edema.  Neurologic: No gross sensory or motor deficits. No tremors or fasciculations noted.    DIAGNOSTIC DATA REVIEWED:  BMET    Component Value Date/Time   NA 142 06/03/2022 1600   K 4.0 06/03/2022 1600   CL 105 06/03/2022 1600   CO2 19 (L) 06/03/2022 1600   GLUCOSE 92 06/03/2022 1600   GLUCOSE 111 (H) 09/05/2021 1453   BUN 7 06/03/2022 1600   CREATININE 0.56 (L) 06/03/2022 1600   CREATININE 0.61 09/05/2021 1453   CALCIUM 9.4 06/03/2022 1600   GFRNONAA >60 08/04/2015 1325   GFRAA >60 08/04/2015 1325   Lab Results  Component Value Date   HGBA1C 6.9 (H) 06/03/2022   HGBA1C 7.1 (H) 09/05/2021   Lab Results  Component Value Date   INSULIN 23.1 01/12/2022   Lab Results  Component Value Date   TSH 3.020 01/12/2022   CBC    Component Value Date/Time   WBC 8.9 09/05/2021 1453   RBC 5.28 (H) 09/05/2021 1453  HGB 14.6 09/05/2021 1453   HCT 44.4 09/05/2021 1453   PLT 298 09/05/2021 1453   MCV 84.1 09/05/2021 1453   MCH 27.7 09/05/2021 1453   MCHC 32.9 09/05/2021 1453   RDW 12.8 09/05/2021 1453   Iron Studies No results found for: "IRON", "TIBC", "FERRITIN", "IRONPCTSAT" Lipid Panel     Component Value Date/Time   CHOL 170 01/12/2022 0856   TRIG 131 01/12/2022 0856   HDL 48 01/12/2022 0856   LDLCALC 99 01/12/2022 0856   Hepatic Function Panel     Component Value Date/Time   PROT 6.9 06/03/2022 1600   ALBUMIN 4.2 06/03/2022 1600   AST 11 06/03/2022 1600   ALT 8 06/03/2022 1600   ALKPHOS 72 06/03/2022 1600   BILITOT 0.4 06/03/2022 1600      Component Value Date/Time   TSH 3.020 01/12/2022 0856   Nutritional Lab Results  Component  Value Date   VD25OH 40.8 06/03/2022   VD25OH 21.3 (L) 01/12/2022     ASSESSMENT AND PLAN  TREATMENT PLAN FOR OBESITY:  Recommended Dietary Goals  Dametria is currently in the action stage of change. As such, her goal is to continue weight management plan. She has agreed to the Category 2 Plan.  Behavioral Intervention  We discussed the following Behavioral Modification Strategies today: increasing lean protein intake, decreasing simple carbohydrates , increasing vegetables, increasing fiber rich foods, avoiding skipping meals, and increasing water intake.  Additional resources provided today: NA  Recommended Physical Activity Goals  Asencion has been advised to work up to 150 minutes of moderate intensity aerobic activity a week and strengthening exercises 2-3 times per week for cardiovascular health, weight loss maintenance and preservation of muscle mass.   She has agreed to Think about ways to increase physical activity and Work on scheduling and tracking physical activity.    Pharmacotherapy We discussed various medication options to help Narcissus with her weight loss efforts and we both agreed to continue Mounjaro 5mg .  ASSOCIATED CONDITIONS ADDRESSED TODAY  Action/Plan  Type 2 diabetes mellitus with obesity -     Continue Tirzepatide; Inject 5 mg into the skin once a week.  Dispense: 2 mL; Refill: 0  Good blood sugar control is important to decrease the likelihood of diabetic complications such as nephropathy, neuropathy, limb loss, blindness, coronary artery disease, and death. Intensive lifestyle modification including diet, exercise and weight loss are the first line of treatment for diabetes.    Generalized obesity  BMI 34.0-34.9,adult         Return in about 3 weeks (around 08/20/2022).Marland Kitchen She was informed of the importance of frequent follow up visits to maximize her success with intensive lifestyle modifications for her multiple health  conditions.   ATTESTASTION STATEMENTS:  Reviewed by clinician on day of visit: allergies, medications, problem list, medical history, surgical history, family history, social history, and previous encounter notes.     Ailene Rud. Izzah Pasqua FNP-C

## 2022-08-19 ENCOUNTER — Encounter: Payer: Self-pay | Admitting: Nurse Practitioner

## 2022-08-19 ENCOUNTER — Ambulatory Visit: Payer: BC Managed Care – PPO | Admitting: Nurse Practitioner

## 2022-08-19 ENCOUNTER — Encounter: Payer: Self-pay | Admitting: Obstetrics and Gynecology

## 2022-08-19 ENCOUNTER — Ambulatory Visit: Payer: BC Managed Care – PPO | Admitting: Obstetrics and Gynecology

## 2022-08-19 VITALS — BP 110/77 | HR 69 | Temp 98.2°F | Ht 61.0 in | Wt 181.0 lb

## 2022-08-19 VITALS — BP 118/77 | HR 84 | Ht 61.0 in | Wt 185.0 lb

## 2022-08-19 DIAGNOSIS — E282 Polycystic ovarian syndrome: Secondary | ICD-10-CM | POA: Diagnosis not present

## 2022-08-19 DIAGNOSIS — E669 Obesity, unspecified: Secondary | ICD-10-CM | POA: Diagnosis not present

## 2022-08-19 DIAGNOSIS — E1169 Type 2 diabetes mellitus with other specified complication: Secondary | ICD-10-CM | POA: Diagnosis not present

## 2022-08-19 DIAGNOSIS — Z7985 Long-term (current) use of injectable non-insulin antidiabetic drugs: Secondary | ICD-10-CM | POA: Diagnosis not present

## 2022-08-19 DIAGNOSIS — Z6834 Body mass index (BMI) 34.0-34.9, adult: Secondary | ICD-10-CM | POA: Diagnosis not present

## 2022-08-19 MED ORDER — NORETHINDRONE 0.35 MG PO TABS: 1.0000 | ORAL_TABLET | Freq: Every day | ORAL | 3 refills | Status: DC

## 2022-08-19 MED ORDER — TIRZEPATIDE 5 MG/0.5ML ~~LOC~~ SOAJ: 5.0000 mg | SUBCUTANEOUS | 0 refills | Status: DC

## 2022-08-19 NOTE — Progress Notes (Signed)
Pt has not been taking OCP for one week- PCP won't refill anymore Pt would like to stay on OCP

## 2022-08-19 NOTE — Progress Notes (Signed)
NEW GYNECOLOGY VISIT  Subjective:  Kendra Bennett is a 42 y.o. G0P0000 with LMP 08/16/22 presenting for discussion of PCOS & birth control.   Longstanding hx PCOS 20+ years. Previously on COCs, but has hx HTN controlled w/ olmesartan and is on mounjaro. Her PCP recommended GYN follow up for discussion of alternative methods of treatment.   Not currently sexually active, has female partner  I personally reviewed the following: Pap 05/24/19 NILM/HPV negative  Past Medical History:  Diagnosis Date   Anxiety    Diabetes    Eczema    High blood pressure    PCOS (polycystic ovarian syndrome)    Vitamin D deficiency    Past Surgical History:  Procedure Laterality Date   WISDOM TOOTH EXTRACTION     Current Outpatient Medications on File Prior to Visit  Medication Sig Dispense Refill   cetirizine (ZYRTEC) 10 MG tablet Take 10 mg by mouth daily.     clobetasol ointment (TEMOVATE) 0.05 % Use 2 x/day to thick areas     desonide (DESOWEN) 0.05 % cream Apply to the face 1-2 times a day as needed for atopic dermatitis     DUPIXENT 300 MG/2ML prefilled syringe Inject into the skin.     olmesartan (BENICAR) 20 MG tablet TAKE 1 TABLET BY MOUTH EVERY DAY 90 tablet 1   VITAMIN D PO Take by mouth.     No current facility-administered medications on file prior to visit.   OB History     Gravida  0   Para  0   Term  0   Preterm  0   AB  0   Living  0      SAB  0   IAB  0   Ectopic  0   Multiple  0   Live Births  0          Social History   Socioeconomic History   Marital status: Married    Spouse name: Not on file   Number of children: Not on file   Years of education: Not on file   Highest education level: Not on file  Occupational History   Not on file  Tobacco Use   Smoking status: Never   Smokeless tobacco: Not on file  Vaping Use   Vaping Use: Never used  Substance and Sexual Activity   Alcohol use: No   Drug use: No   Sexual activity: Not  Currently  Other Topics Concern   Not on file  Social History Narrative   Not on file   Social Determinants of Health   Financial Resource Strain: Not on file  Food Insecurity: Not on file  Transportation Needs: Not on file  Physical Activity: Not on file  Stress: Not on file  Social Connections: Not on file  Intimate Partner Violence: Not on file    Objective:   Vitals:   08/19/22 0853  BP: 118/77  Pulse: 84  Weight: 185 lb (83.9 kg)  Height:  (1.549 m)    General:  Alert, oriented and cooperative. Patient is in no acute distress.  Skin: Skin is warm and dry. No rash noted.   Cardiovascular: Normal heart rate noted  Respiratory: Normal respiratory effort, no problems with respiration noted   Assessment and Plan:  Kendra Bennett is a 42 y.o. with PCOS and desired for continued management with oral contraceptives  PCOS - Discussed that one of the primary goals of management of PCOS is endometrial protection. Reviewed  that combined oral contraceptives are used for this purpose and have the added effects of usually predictable bleeding and contraception. - Given her hx HTN, we discussed that estrogen-containing methods are considering MEC category 3 (risks likely outweight benefits) due to risk of stroke/cardiovascular disease - Discussed management options would including progestin-only pills, Depo, Nexplanon or LNG-IUD. Given that she is trying to lose weight, is taking Mounjaro, and desires lighter periods I recommend LNG-IUD. She would prefer a pill option - We discussed potential lower oral contraceptive efficacy with Mounjaro, but that it likely still offers some level of endometrial protection. Recommend back up contraception if she is sexually active.  -     norethindrone (MICRONOR) 0.35 MG tablet; Take 1 tablet (0.35 mg total) by mouth daily.  Return in about 3 months (around 11/18/2022) for annual exam.  Future Appointments  Date Time Provider Department  Center  08/19/2022 11:45 AM Irene Limbo, FNP [facility] None   Lennart Pall, MD

## 2022-08-19 NOTE — Patient Instructions (Signed)
Your insurance did not cover Slynd. I have sent the mini pill instead.

## 2022-08-19 NOTE — Progress Notes (Signed)
Office: [phone]  /  Fax: [phone]  WEIGHT SUMMARY AND BIOMETRICS  Weight Lost Since Last Visit: 2lb  No data recorded  Vitals Temp: 98.2 F (36.8 C) BP: 110/77 Pulse Rate: 69 SpO2: 98 %   Anthropometric Measurements Height:  (1.549 m) Weight: 181 lb (82.1 kg) BMI (Calculated): 34.22 Weight at Last Visit: 183kb Weight Lost Since Last Visit: 2lb Starting Weight: 187lb Total Weight Loss (lbs): 6 lb (2.722 kg)   Body Composition  Body Fat %: 39.9 % Fat Mass (lbs): 72.6 lbs Muscle Mass (lbs): 103.6 lbs Total Body Water (lbs): 75.6 lbs Visceral Fat Rating : 9   Other Clinical Data Fasting: No Today's Visit #: 7 Starting Date: 01/12/22     HPI  Chief Complaint: OBESITY  Kendra Bennett is here to discuss her progress with her obesity treatment plan. She is on the the Category 2 Plan and states she is following her eating plan approximately 80 % of the time. She states she is exercising 45 minutes-1 hour 3 days per week-walking, online classes.   Interval History:  Since last office visit she has lost 2 pounds. She is struggling with cravings. She also struggles with stopping eating.  She says once she starts she can't stop.   She has been baking more.  She states it's hard to drink enough water daily.  She is drinking a protein shake, coffee and unsweetened tea daily.  She has been more active.  Her goal is to walk 6,000 steps per day.   Pharmacotherapy for weight loss: She is not currently taking medications  for medical weight loss.    Previous pharmacotherapy for medical weight loss:  none  Bariatric surgery:  Patient has not had bariatric surgery  Pharmacotherapy for DMT2:  She is currently taking Mounjaro .  Denies side effects.   Last A1c was 6.9 She is not checking BS at home.   Episodes of hypoglycemia: no On ARB  Last eye exam:  2023 She has tried Trulicity in the past.    Notes some changes in vision, blurry vision, over the past 1-2  weeks.    Lab Results  Component Value Date   HGBA1C 6.9 (H) 06/03/2022   HGBA1C 6.6 (H) 01/12/2022   HGBA1C 7.1 (H) 09/05/2021   Lab Results  Component Value Date   LDLCALC 99 01/12/2022   CREATININE 0.56 (L) 06/03/2022      PHYSICAL EXAM:  Blood pressure 110/77, pulse 69, temperature 98.2 F (36.8 C), height  (1.549 m), weight 181 lb (82.1 kg), last menstrual period 08/16/2022, SpO2 98 %. Body mass index is 34.2 kg/m.  General: She is overweight, cooperative, alert, well developed, and in no acute distress. PSYCH: Has normal mood, affect and thought process.   Extremities: No edema.  Neurologic: No gross sensory or motor deficits. No tremors or fasciculations noted.    DIAGNOSTIC DATA REVIEWED:  BMET    Component Value Date/Time   NA 142 06/03/2022 1600   K 4.0 06/03/2022 1600   CL 105 06/03/2022 1600   CO2 19 (L) 06/03/2022 1600   GLUCOSE 92 06/03/2022 1600   GLUCOSE 111 (H) 09/05/2021 1453   BUN 7 06/03/2022 1600   CREATININE 0.56 (L) 06/03/2022 1600   CREATININE 0.61 09/05/2021 1453   CALCIUM 9.4 06/03/2022 1600   GFRNONAA >60 08/04/2015 1325   GFRAA >60 08/04/2015 1325   Lab Results  Component Value Date   HGBA1C 6.9 (H) 06/03/2022   HGBA1C 7.1 (H) 09/05/2021  Lab Results  Component Value Date   INSULIN 23.1 01/12/2022   Lab Results  Component Value Date   TSH 3.020 01/12/2022   CBC    Component Value Date/Time   WBC 8.9 09/05/2021 1453   RBC 5.28 (H) 09/05/2021 1453   HGB 14.6 09/05/2021 1453   HCT 44.4 09/05/2021 1453   PLT 298 09/05/2021 1453   MCV 84.1 09/05/2021 1453   MCH 27.7 09/05/2021 1453   MCHC 32.9 09/05/2021 1453   RDW 12.8 09/05/2021 1453   Iron Studies No results found for: "IRON", "TIBC", "FERRITIN", "IRONPCTSAT" Lipid Panel     Component Value Date/Time   CHOL 170 01/12/2022 0856   TRIG 131 01/12/2022 0856   HDL 48 01/12/2022 0856   LDLCALC 99 01/12/2022 0856   Hepatic Function Panel     Component Value  Date/Time   PROT 6.9 06/03/2022 1600   ALBUMIN 4.2 06/03/2022 1600   AST 11 06/03/2022 1600   ALT 8 06/03/2022 1600   ALKPHOS 72 06/03/2022 1600   BILITOT 0.4 06/03/2022 1600      Component Value Date/Time   TSH 3.020 01/12/2022 0856   Nutritional Lab Results  Component Value Date   VD25OH 40.8 06/03/2022   VD25OH 21.3 (L) 01/12/2022     ASSESSMENT AND PLAN  TREATMENT PLAN FOR OBESITY:  Recommended Dietary Goals  Conleigh is currently in the action stage of change. As such, her goal is to continue weight management plan. She has agreed to the Category 2 Plan.  Behavioral Intervention  We discussed the following Behavioral Modification Strategies today: increasing lean protein intake, decreasing simple carbohydrates , increasing vegetables, increasing lower glycemic fruits, increasing fiber rich foods, avoiding skipping meals, increasing water intake, continue to practice mindfulness when eating, and planning for success.  Additional resources provided today: NA  Recommended Physical Activity Goals  Tiaira has been advised to work up to 150 minutes of moderate intensity aerobic activity a week and strengthening exercises 2-3 times per week for cardiovascular health, weight loss maintenance and preservation of muscle mass.   She has agreed to Continue current level of physical activity    ASSOCIATED CONDITIONS ADDRESSED TODAY  Action/Plan  Type 2 diabetes mellitus with obesity -     continue Tirzepatide; Inject 5 mg into the skin once a week.  Dispense: 2 mL; Refill: 0.  Will not increase dose today.  Would like to schedule eye exam for further evaluation.    Generalized obesity  BMI 34.0-34.9,adult     Will check labs at next visit.    Patient to call and schedule an eye exam.  Will consider increasing mounjaro after eye exam.    Return in about 3 weeks (around 09/09/2022) for fasting labs.. She was informed of the importance of frequent follow up visits to  maximize her success with intensive lifestyle modifications for her multiple health conditions.   ATTESTASTION STATEMENTS:  Reviewed by clinician on day of visit: allergies, medications, problem list, medical history, surgical history, family history, social history, and previous encounter notes.     Theodis Sato. Adelena Desantiago FNP-C

## 2022-08-20 LAB — HM DIABETES EYE EXAM

## 2022-08-23 ENCOUNTER — Encounter: Payer: Self-pay | Admitting: Family

## 2022-08-24 ENCOUNTER — Encounter: Payer: BC Managed Care – PPO | Admitting: Obstetrics and Gynecology

## 2022-09-16 ENCOUNTER — Ambulatory Visit: Payer: BC Managed Care – PPO | Admitting: Bariatrics

## 2022-09-16 ENCOUNTER — Encounter: Payer: Self-pay | Admitting: Bariatrics

## 2022-09-16 VITALS — BP 106/63 | HR 55 | Temp 97.6°F | Ht 61.0 in | Wt 181.0 lb

## 2022-09-16 DIAGNOSIS — Z6834 Body mass index (BMI) 34.0-34.9, adult: Secondary | ICD-10-CM | POA: Diagnosis not present

## 2022-09-16 DIAGNOSIS — E1169 Type 2 diabetes mellitus with other specified complication: Secondary | ICD-10-CM | POA: Diagnosis not present

## 2022-09-16 DIAGNOSIS — F5089 Other specified eating disorder: Secondary | ICD-10-CM | POA: Diagnosis not present

## 2022-09-16 DIAGNOSIS — K5909 Other constipation: Secondary | ICD-10-CM

## 2022-09-16 DIAGNOSIS — Z7985 Long-term (current) use of injectable non-insulin antidiabetic drugs: Secondary | ICD-10-CM

## 2022-09-16 DIAGNOSIS — E669 Obesity, unspecified: Secondary | ICD-10-CM | POA: Diagnosis not present

## 2022-09-16 MED ORDER — TIRZEPATIDE 5 MG/0.5ML ~~LOC~~ SOAJ: 5.0000 mg | SUBCUTANEOUS | 0 refills | Status: DC

## 2022-09-16 MED ORDER — TOPIRAMATE 50 MG PO TABS
50.0000 mg | ORAL_TABLET | Freq: Every day | ORAL | 0 refills | Status: DC
Start: 2022-09-16 — End: 2022-09-30

## 2022-09-16 NOTE — Progress Notes (Addendum)
WEIGHT SUMMARY AND BIOMETRICS  Weight Lost Since Last Visit: 0  Weight Gained Since Last Visit: 0   Vitals Temp: 97.6 F (36.4 C) BP: 106/63 Pulse Rate: (!) 55 SpO2: 99 %   Anthropometric Measurements Height: 5\' 1"  (1.549 m) Weight: 181 lb (82.1 kg) BMI (Calculated): 34.22 Weight at Last Visit: 181lb Weight Lost Since Last Visit: 0 Weight Gained Since Last Visit: 0 Starting Weight: 187lb Total Weight Loss (lbs): 6 lb (2.722 kg)   Body Composition  Body Fat %: 40.1 % Fat Mass (lbs): 72.6 lbs Muscle Mass (lbs): 103 lbs Total Body Water (lbs): 74.6 lbs Visceral Fat Rating : 9   Other Clinical Data Fasting: yes Labs: yes Today's Visit #: 8 Starting Date: 01/12/22 Comments: .    OBESITY Kendra Bennett is here to discuss her progress with her obesity treatment plan along with follow-up of her obesity related diagnoses.     Nutrition Plan: the Category 2 plan - 90% adherence.  Current exercise:  Walking and strength training.  Interim History:  Her weight remains the same. She is having some craving in the evening.  Eating all of the food on the plan.   Pharmacotherapy: Kendra Bennett is on Wegovy 0.50 mg SQ weekly Adverse side effects: Constipation Hunger is moderately controlled.  Cravings are moderately controlled.  Assessment/Plan:   1. Type 2 diabetes mellitus with obesity (HCC)  HgbA1c is at goal. Last A1c was 6.9 CBGs: Not checking      Episodes of hypoglycemia: no Medication(s): Mounjaro 5.0 mg SQ weekly  Lab Results  Component Value Date   HGBA1C 6.9 (H) 06/03/2022   HGBA1C 6.6 (H) 01/12/2022   HGBA1C 7.1 (H) 09/05/2021   Lab Results  Component Value Date   LDLCALC 99 01/12/2022   CREATININE 0.56 (L) 06/03/2022   No results found for: "GFR"  Plan: Continue and refill Mounjaro 5.0 mg SQ weekly Continue all other medications.  Will keep all carbohydrates low both sweets and starches.  Will continue exercise regimen to 30 to 60  minutes on most days of the week.  Aim for 7 to 9 hours of sleep nightly.  Eat more low glycemic index foods.    Eating disorder/emotional eating Kendra Bennett has had issues with nighttime eating. Currently this is poorly controlled. Overall mood is stable. Denies suicidal/homicidal ideation. Medication(s): Topamax 50 mg daily with dinner  Plan:  Specifically regarding patient's less desirable eating habits and patterns, we employed the technique of small changes when she cannot fully commit to her prudent nutritional plan. Consider a referral to Dr. Dewaine Conger, PhD psychologist to help with eating behaviors.  Discussed distractions to curb eating behaviors. Discussed activities to do with one's hands in the evening  Be sure to get adequate rest as lack of rest can trigger appetite.  Have plan in place for stressful events.  Consider other rewards besides food.    Labs done today ((CMP, Lipids, HgbA1c, insulin, vitamin D).    Generalized Obesity: Current BMI BMI (Calculated): 34.22   Pharmacotherapy Plan Continue and refill  Mounjaro 5.0 mg SQ weekly  Sharen is currently in the action stage of change. As such, her goal is to continue with weight loss efforts.  She has agreed to the Category 2 plan.  Exercise goals: For substantial health benefits, adults should do at least 150 minutes (2 hours and 30 minutes) a week of moderate-intensity, or 75 minutes (1 hour and 15 minutes) a week of vigorous-intensity aerobic physical activity, or an equivalent combination of  moderate- and vigorous-intensity aerobic activity. Aerobic activity should be performed in episodes of at least 10 minutes, and preferably, it should be spread throughout the week.  Behavioral modification strategies: increasing lean protein intake, increase water intake, better snacking choices, and planning for success.  Camila has agreed to follow-up with our clinic in 4 weeks.   No orders of the defined types were placed in  this encounter.   There are no discontinued medications.   No orders of the defined types were placed in this encounter.     Objective:   VITALS: Per patient if applicable, see vitals. GENERAL: Alert and in no acute distress. CARDIOPULMONARY: No increased WOB. Speaking in clear sentences.  PSYCH: Pleasant and cooperative. Speech normal rate and rhythm. Affect is appropriate. Insight and judgement are appropriate. Attention is focused, linear, and appropriate.  NEURO: Oriented as arrived to appointment on time with no prompting.   Attestation Statements:     This was prepared with the assistance of Engineer, civil (consulting).  Occasional wrong-word or sound-a-like substitutions may have occurred due to the inherent limitations of voice recognition software.   Corinna Capra, DO

## 2022-09-17 ENCOUNTER — Ambulatory Visit: Payer: BC Managed Care – PPO | Admitting: Nurse Practitioner

## 2022-09-17 NOTE — Telephone Encounter (Signed)
Can you please advise? Patient was seen yesterday  

## 2022-09-23 ENCOUNTER — Other Ambulatory Visit: Payer: Self-pay | Admitting: Nurse Practitioner

## 2022-09-23 DIAGNOSIS — E1169 Type 2 diabetes mellitus with other specified complication: Secondary | ICD-10-CM

## 2022-09-30 ENCOUNTER — Other Ambulatory Visit: Payer: Self-pay | Admitting: Bariatrics

## 2022-10-08 ENCOUNTER — Ambulatory Visit (INDEPENDENT_AMBULATORY_CARE_PROVIDER_SITE_OTHER): Payer: BC Managed Care – PPO | Admitting: Obstetrics and Gynecology

## 2022-10-08 ENCOUNTER — Encounter: Payer: Self-pay | Admitting: Obstetrics and Gynecology

## 2022-10-08 VITALS — BP 114/72 | HR 79 | Ht 61.0 in | Wt 185.0 lb

## 2022-10-08 DIAGNOSIS — Z01419 Encounter for gynecological examination (general) (routine) without abnormal findings: Secondary | ICD-10-CM | POA: Diagnosis not present

## 2022-10-08 DIAGNOSIS — Z1231 Encounter for screening mammogram for malignant neoplasm of breast: Secondary | ICD-10-CM

## 2022-10-08 NOTE — Patient Instructions (Addendum)
Please schedule your mammogram within the next few weeks. Let me know if/when you start trying to conceive. Depending on how regular your cycles are, we could consider giving medications to help you ovulate. I would start taking a prenatal vitamin if you think you're going to try in the next year.

## 2022-10-08 NOTE — Progress Notes (Signed)
ANNUAL EXAM Patient name: Kendra Bennett MRN [id number]  Date of birth: May 08, 1980 Chief Complaint:   Annual Exam  History of Present Illness:   Kendra Bennett is a 42 y.o. G0P0000 with Patient's last menstrual period was 09/24/2022. being seen today for a routine annual exam.  Current complaints: None   Upstream - 10/08/22 0832       Pregnancy Intention Screening   Does the patient want to become pregnant in the next year? Yes    Does the patient's partner want to become pregnant in the next year? Yes    Would the patient like to discuss contraceptive options today? Yes      Contraception Wrap Up   Current Method Oral Contraceptive    End Method Oral Contraceptive    Contraception Counseling Provided No    How was the end contraceptive method provided? N/A            The pregnancy intention screening data noted above was reviewed. Potential methods of contraception were discussed. The patient elected to proceed with Oral Contraceptive.   Last pap 05/24/19. Results were: NILM w/ HRHPV negative. H/O abnormal pap: no Last mammogram: never. Family h/o breast cancer: no Last colonoscopy: n/a Family h/o colorectal cancer: no     01/12/2022    7:27 AM 09/05/2021    2:15 PM  Depression screen PHQ 2/9  Decreased Interest 3 0  Down, Depressed, Hopeless 0 0  PHQ - 2 Score 3 0  Altered sleeping 0   Tired, decreased energy 3   Change in appetite 0   Feeling bad or failure about yourself  0   Trouble concentrating 0   Moving slowly or fidgety/restless 0   Suicidal thoughts 0   PHQ-9 Score 6   Difficult doing work/chores Not difficult at all          No data to display           Review of Systems:   Pertinent items are noted in HPI Denies any headaches, blurred vision, fatigue, shortness of breath, chest pain, abdominal pain, abnormal vaginal discharge/itching/odor/irritation, problems with periods, bowel movements, urination, or intercourse unless otherwise  stated above. Pertinent History Reviewed:  Reviewed past medical,surgical, social and family history.  Reviewed problem list, medications and allergies. Physical Assessment:   Vitals:   10/08/22 0801  BP: 114/72  Pulse: 79  Weight: 185 lb (83.9 kg)  Height: 5\' 1"  (1.549 m)  Body mass index is 34.96 kg/m.        Physical Examination:   General appearance - well appearing, and in no distress  Mental status - alert, oriented to person, place, and time  Chest - respiratory effort normal  Heart - normal peripheral perfusion  Breasts - breasts appear normal, no suspicious masses, no skin or nipple changes or axillary nodes  Abdomen - soft, nontender, nondistended, no masses or organomegaly  Pelvic - Discussed/offered - no current complaints so pt opted to defer Chaperone present for exam  No results found for this or any previous visit (from the past 24 hour(s)).  Assessment & Plan:  1) Well-Woman Exam Mammogram: schedule screening mammo as soon as possible Colonoscopy: @ 45yo, or sooner if problems Pap: Up to date GC/CT: declines HIV/HCV: declines  2) Preconception counseling Reviewed implications of diabetes, PCOS, and age for fertility & pregnancy Recommended PNV Will continue mini pill for now. Discussed timed intercourse and possibility of OI in s/o PCOS. Pt will let us know if she is  TTC  Labs/procedures today:   Orders Placed This Encounter  Procedures   MM Digital Screening   Meds: No orders of the defined types were placed in this encounter.  Follow-up: Return in about 1 year (around 10/08/2023) for annual exam or sooner as needed.  Lennart Pall, MD 10/08/2022 8:33 AM

## 2022-10-14 ENCOUNTER — Ambulatory Visit: Payer: BC Managed Care – PPO | Admitting: Bariatrics

## 2022-10-23 ENCOUNTER — Encounter: Payer: Self-pay | Admitting: *Deleted

## 2022-10-23 ENCOUNTER — Other Ambulatory Visit: Payer: Self-pay | Admitting: Family

## 2022-10-26 ENCOUNTER — Ambulatory Visit: Payer: BC Managed Care – PPO | Admitting: Nurse Practitioner

## 2022-10-27 ENCOUNTER — Other Ambulatory Visit: Payer: Self-pay | Admitting: Bariatrics

## 2022-10-27 DIAGNOSIS — E1169 Type 2 diabetes mellitus with other specified complication: Secondary | ICD-10-CM

## 2022-11-02 ENCOUNTER — Ambulatory Visit: Payer: BC Managed Care – PPO | Admitting: Bariatrics

## 2022-11-02 ENCOUNTER — Encounter: Payer: Self-pay | Admitting: Bariatrics

## 2022-11-02 VITALS — BP 110/75 | HR 52 | Temp 97.7°F | Ht 61.0 in | Wt 181.0 lb

## 2022-11-02 DIAGNOSIS — Z7985 Long-term (current) use of injectable non-insulin antidiabetic drugs: Secondary | ICD-10-CM

## 2022-11-02 DIAGNOSIS — Z6834 Body mass index (BMI) 34.0-34.9, adult: Secondary | ICD-10-CM

## 2022-11-02 DIAGNOSIS — I10 Essential (primary) hypertension: Secondary | ICD-10-CM | POA: Diagnosis not present

## 2022-11-02 DIAGNOSIS — E1169 Type 2 diabetes mellitus with other specified complication: Secondary | ICD-10-CM

## 2022-11-02 DIAGNOSIS — E669 Obesity, unspecified: Secondary | ICD-10-CM | POA: Diagnosis not present

## 2022-11-02 MED ORDER — TIRZEPATIDE 7.5 MG/0.5ML ~~LOC~~ SOAJ: 7.5000 mg | SUBCUTANEOUS | 0 refills | Status: DC

## 2022-11-02 NOTE — Progress Notes (Signed)
WEIGHT SUMMARY AND BIOMETRICS  Weight Lost Since Last Visit: 0lb  Weight Gained Since Last Visit: 0lb   Vitals Temp: 97.7 F (36.5 C) BP: 110/75 Pulse Rate: (!) 52 SpO2: 98 %   Anthropometric Measurements Height: 5\' 1"  (1.549 m) Weight: 181 lb (82.1 kg) BMI (Calculated): 34.22 Weight at Last Visit: 181lb Weight Lost Since Last Visit: 0lb Weight Gained Since Last Visit: 0lb Starting Weight: 187lb Total Weight Loss (lbs): 6 lb (2.722 kg)   Body Composition  Body Fat %: 39.4 % Fat Mass (lbs): 71.4 lbs Muscle Mass (lbs): 104 lbs Total Body Water (lbs): 75.6 lbs Visceral Fat Rating : 9   Other Clinical Data Fasting: Yes Labs: No Today's Visit #: 9 Starting Date: 01/12/22    OBESITY Kendra Bennett is here to discuss her progress with her obesity treatment plan along with follow-up of her obesity related diagnoses.    Nutrition Plan: the Category 2 plan - 50% adherence.  Current exercise: none  Interim History:  Her weight stays the same.  Eating all of the food on the plan., Protein intake is as prescribed, Is not skipping meals, and Water intake is adequate.  Pharmacotherapy: Kendra Bennett is on Zepbound 7.5 mg SQ weekly Adverse side effects: None Hunger is moderately controlled.  Cravings are moderately controlled.  Assessment/Plan:  Type II Diabetes HgbA1c is at goal. Last A1c was 6.9 CBGs: Not checking      Episodes of hypoglycemia: no Medication(s): Mounjaro 7.5 mg SQ weekly  Lab Results  Component Value Date   HGBA1C 6.9 (H) 06/03/2022   HGBA1C 6.6 (H) 01/12/2022   HGBA1C 7.1 (H) 09/05/2021   Lab Results  Component Value Date   LDLCALC 99 01/12/2022   CREATININE 0.56 (L) 06/03/2022    Plan: Continue and increase dose Mounjaro 7.5 mg SQ weekly Continue all other medications.  Will keep all carbohydrates low both sweets and starches.  Will continue exercise regimen to 30 to 60 minutes on most days of the week.  Aim for 7 to 9 hours of  sleep nightly.  Eat more low glycemic index foods.  She will increase her water consumption.   Hypertension Hypertension well controlled.  Medication(s): Benicar  BP Readings from Last 3 Encounters:  11/02/22 110/75  10/08/22 114/72  09/16/22 106/63   Lab Results  Component Value Date   CREATININE 0.56 (L) 06/03/2022   CREATININE 0.75 01/12/2022   CREATININE 0.61 09/05/2021   Plan: Continue all antihypertensives at current dosages. No added salt. Will keep sodium content to 1,500 mg or less per day.     Generalized Obesity: Current BMI BMI (Calculated): 34.22   Pharmacotherapy Plan Continue and increase dose  Mounjaro 7.5 mg SQ weekly  Kendra Bennett is currently in the action stage of change. As such, her goal is to continue with weight loss efforts.  She has agreed to the Category 2 plan.  Exercise goals: All adults should avoid inactivity. Some physical activity is better than none, and adults who participate in any amount of physical activity gain some health benefits.  Behavioral modification strategies: increasing lean protein intake, meal planning , planning for success, and decrease snacking .  Kendra Bennett has agreed to follow-up with our clinic in 4 weeks.      Objective:   VITALS: Per patient if applicable, see vitals. GENERAL: Alert and in no acute distress. CARDIOPULMONARY: No increased WOB. Speaking in clear sentences.  PSYCH: Pleasant and cooperative. Speech normal rate and rhythm. Affect is appropriate. Insight and judgement are  appropriate. Attention is focused, linear, and appropriate.  NEURO: Oriented as arrived to appointment on time with no prompting.   Attestation Statements:   This was prepared with the assistance of Engineer, civil (consulting).  Occasional wrong-word or sound-a-like substitutions may have occurred due to the inherent limitations of voice recognition software.   Corinna Capra, DO

## 2022-11-02 NOTE — Progress Notes (Deleted)
Office: [phone]  /  Fax: [phone]  WEIGHT SUMMARY AND BIOMETRICS  Weight Lost Since Last Visit: 0lb  Weight Gained Since Last Visit: 0lb   Vitals Temp: 97.7 F (36.5 C) BP: 110/75 Pulse Rate: (!) 52 SpO2: 98 %   Anthropometric Measurements Height: 5\' 1"  (1.549 m) Weight: 181 lb (82.1 kg) BMI (Calculated): 34.22 Weight at Last Visit: 181lb Weight Lost Since Last Visit: 0lb Weight Gained Since Last Visit: 0lb Starting Weight: 187lb Total Weight Loss (lbs): 6 lb (2.722 kg)   Body Composition  Body Fat %: 39.4 % Fat Mass (lbs): 71.4 lbs Muscle Mass (lbs): 104 lbs Total Body Water (lbs): 75.6 lbs Visceral Fat Rating : 9   Other Clinical Data Fasting: Yes Labs: No Today's Visit #: 9 Starting Date: 01/12/22     HPI  Chief Complaint: OBESITY  Kendra Bennett is here to discuss her progress with her obesity treatment plan. She is on the the Category 2 Plan and states she is following her eating plan approximately 50 % of the time. She states she is exercising 0 minutes 0 days per week.   Interval History:  Since last office visit she ***   Pharmacotherapy for weight loss: She {srtis (Optional):29129} currently taking {srtpreviousweightlossmeds (Optional):29124} for medical weight loss.  Denies side effects.    Previous pharmacotherapy for medical weight loss:  {srtpreviousweightlossmeds (Optional):29124}  She stopped *** due to side effects of ***.  Bariatric surgery:  Patient is status post {srtweightlosssurgery:29125} by Dr.  Marland Kitchen in ***.  Her highest weight prior to surgery was *** and her nadir weight after surgery was ***.  She is taking {srtvitamins (Optional):29126}.  She reports {srtrestriction (Optional):29127} restriction.     PHYSICAL EXAM:  Blood pressure 110/75, pulse (!) 52, temperature 97.7 F (36.5 C), height 5\' 1"  (1.549 m), weight 181 lb (82.1 kg), last menstrual period 09/24/2022, SpO2 98 %. Body mass index is 34.2 kg/m.  General:  She is overweight, cooperative, alert, well developed, and in no acute distress. PSYCH: Has normal mood, affect and thought process.   Extremities: No edema.  Neurologic: No gross sensory or motor deficits. No tremors or fasciculations noted.    DIAGNOSTIC DATA REVIEWED:  BMET    Component Value Date/Time   NA 142 06/03/2022 1600   K 4.0 06/03/2022 1600   CL 105 06/03/2022 1600   CO2 19 (L) 06/03/2022 1600   GLUCOSE 92 06/03/2022 1600   GLUCOSE 111 (H) 09/05/2021 1453   BUN 7 06/03/2022 1600   CREATININE 0.56 (L) 06/03/2022 1600   CREATININE 0.61 09/05/2021 1453   CALCIUM 9.4 06/03/2022 1600   GFRNONAA >60 08/04/2015 1325   GFRAA >60 08/04/2015 1325   Lab Results  Component Value Date   HGBA1C 6.9 (H) 06/03/2022   HGBA1C 7.1 (H) 09/05/2021   Lab Results  Component Value Date   INSULIN 23.1 01/12/2022   Lab Results  Component Value Date   TSH 3.020 01/12/2022   CBC    Component Value Date/Time   WBC 8.9 09/05/2021 1453   RBC 5.28 (H) 09/05/2021 1453   HGB 14.6 09/05/2021 1453   HCT 44.4 09/05/2021 1453   PLT 298 09/05/2021 1453   MCV 84.1 09/05/2021 1453   MCH 27.7 09/05/2021 1453   MCHC 32.9 09/05/2021 1453   RDW 12.8 09/05/2021 1453   Iron Studies No results found for: "IRON", "TIBC", "FERRITIN", "IRONPCTSAT" Lipid Panel     Component Value Date/Time   CHOL 170 01/12/2022 0856   TRIG  131 01/12/2022 0856   HDL 48 01/12/2022 0856   LDLCALC 99 01/12/2022 0856   Hepatic Function Panel     Component Value Date/Time   PROT 6.9 06/03/2022 1600   ALBUMIN 4.2 06/03/2022 1600   AST 11 06/03/2022 1600   ALT 8 06/03/2022 1600   ALKPHOS 72 06/03/2022 1600   BILITOT 0.4 06/03/2022 1600      Component Value Date/Time   TSH 3.020 01/12/2022 0856   Nutritional Lab Results  Component Value Date   VD25OH 40.8 06/03/2022   VD25OH 21.3 (L) 01/12/2022     ASSESSMENT AND PLAN  TREATMENT PLAN FOR OBESITY:  Recommended Dietary Goals  Kendra Bennett is  currently in the action stage of change. As such, her goal is to continue weight management plan. She has agreed to {MWMwtlossportion/plan2:23431}.  Behavioral Intervention  We discussed the following Behavioral Modification Strategies today: {EMWMwtlossstrategies:28914::"increasing lean protein intake","decreasing simple carbohydrates ","increasing vegetables","increasing lower glycemic fruits","increasing water intake","continue to practice mindfulness when eating","planning for success"}.  Additional resources provided today: NA  Recommended Physical Activity Goals  Kendra Bennett has been advised to work up to 150 minutes of moderate intensity aerobic activity a week and strengthening exercises 2-3 times per week for cardiovascular health, weight loss maintenance and preservation of muscle mass.   She has agreed to {EMEXERCISE:28847::"Think about ways to increase daily physical activity and overcoming barriers to exercise"}   Pharmacotherapy We discussed various medication options to help Kendra Bennett with her weight loss efforts and we both agreed to ***.  ASSOCIATED CONDITIONS ADDRESSED TODAY  Action/Plan  There are no diagnoses linked to this encounter.       No follow-ups on file.Marland Kitchen She was informed of the importance of frequent follow up visits to maximize her success with intensive lifestyle modifications for her multiple health conditions.   ATTESTASTION STATEMENTS:  Reviewed by clinician on day of visit: allergies, medications, problem list, medical history, surgical history, family history, social history, and previous encounter notes.   Time spent on visit including pre-visit chart review and post-visit care and charting was *** minutes.    Theodis Sato. Tickerhoff FNP-C

## 2022-11-20 ENCOUNTER — Telehealth: Payer: Self-pay

## 2022-11-20 ENCOUNTER — Encounter: Payer: BC Managed Care – PPO | Admitting: Family

## 2022-11-20 ENCOUNTER — Encounter: Payer: Self-pay | Admitting: Family

## 2022-11-20 ENCOUNTER — Ambulatory Visit: Payer: BC Managed Care – PPO | Admitting: Family

## 2022-11-20 VITALS — BP 110/62 | HR 69 | Temp 98.5°F | Ht 61.0 in | Wt 188.0 lb

## 2022-11-20 DIAGNOSIS — R051 Acute cough: Secondary | ICD-10-CM | POA: Diagnosis not present

## 2022-11-20 DIAGNOSIS — I10 Essential (primary) hypertension: Secondary | ICD-10-CM

## 2022-11-20 MED ORDER — OLMESARTAN MEDOXOMIL 20 MG PO TABS: 20.0000 mg | ORAL_TABLET | Freq: Every day | ORAL | 3 refills | Status: DC

## 2022-11-20 NOTE — Telephone Encounter (Signed)
Pt returned call and will change physical to sick visit.

## 2022-11-20 NOTE — Telephone Encounter (Signed)
Called pt and left a VM asking pt to call the office back to determine if pt would like to come change visit to a sick visit and schedule her physical at a later date. Please see Mychart message pt states she is concerned she may have COVID.

## 2022-11-20 NOTE — Progress Notes (Unsigned)
Kendra Bennett is a 42 y.o. female with the following history as recorded in EpicCare:  Patient Active Problem List   Diagnosis Date Noted   Generalized obesity 06/03/2022   Fatty liver 06/03/2022   Vitamin D insufficiency 01/26/2022   Other fatigue 01/12/2022   SOB (shortness of breath) on exertion 01/12/2022   PCOS (polycystic ovarian syndrome) 01/12/2022   Essential hypertension 01/12/2022   Diabetes mellitus type 2 in obese 01/12/2022   Health care maintenance 01/12/2022   B12 deficiency 01/12/2022   Vitamin D deficiency 01/12/2022   Class 2 severe obesity with serious comorbidity and body mass index (BMI) of 35.0 to 35.9 in adult (HCC) 01/12/2022   Intrinsic atopic dermatitis 12/04/2020    Current Outpatient Medications  Medication Sig Dispense Refill   cetirizine (ZYRTEC) 10 MG tablet Take 10 mg by mouth daily.     clobetasol ointment (TEMOVATE) 0.05 % Use 2 x/day to thick areas     desonide (DESOWEN) 0.05 % cream Apply to the face 1-2 times a day as needed for atopic dermatitis     DUPIXENT 300 MG/2ML prefilled syringe Inject into the skin.     norethindrone (MICRONOR) 0.35 MG tablet Take 1 tablet (0.35 mg total) by mouth daily. 90 tablet 3   tirzepatide (MOUNJARO) 7.5 MG/0.5ML Pen Inject 7.5 mg into the skin once a week. 2 mL 0   VITAMIN D PO Take by mouth.     olmesartan (BENICAR) 20 MG tablet Take 1 tablet (20 mg total) by mouth daily. 90 tablet 3   No current facility-administered medications for this visit.    Allergies: Ciprofloxacin, Penicillins, and Prednisolone  Past Medical History:  Diagnosis Date   Anxiety    Diabetes (HCC)    Eczema    High blood pressure    PCOS (polycystic ovarian syndrome)    Vitamin D deficiency     Past Surgical History:  Procedure Laterality Date   WISDOM TOOTH EXTRACTION      Family History  Problem Relation Age of Onset   High blood pressure Mother    Heart disease Mother    Anxiety disorder Mother    Diabetes Father     High blood pressure Father    High Cholesterol Father    Kidney disease Father     Social History   Tobacco Use   Smoking status: Never   Smokeless tobacco: Not on file  Substance Use Topics   Alcohol use: No    Subjective:   Cough/ cold x 2 days; + sneezing, runny nose, cough; was concerned that she may have COVID as she flew home from [place] yesterday;   Objective:  Vitals:   11/20/22 1314  BP: 110/62  Pulse: 69  Temp: 98.5 F (36.9 C)  TempSrc: Oral  SpO2: 97%  Weight: 188 lb (85.3 kg)  Height: 5\' 1"  (1.549 m)    General: Well developed, well nourished, in no acute distress  Skin : Warm and dry.  Head: Normocephalic and atraumatic  Eyes: Sclera and conjunctiva clear; pupils round and reactive to light; extraocular movements intact  Ears: External normal; canals clear; tympanic membranes normal  Oropharynx: Pink, supple. No suspicious lesions  Neck: Supple without thyromegaly, adenopathy  Lungs: Respirations unlabored; clear to auscultation bilaterally without wheeze, rales, rhonchi  CVS exam: normal rate and regular rhythm.  Neurologic: Alert and oriented; speech intact; face symmetrical; moves all extremities well; CNII-XII intact without focal deficit   Assessment:  1. Acute cough   2.  Essential hypertension     Plan:  COVID test is negative; suspect viral illness; symptomatic treatment discussed; increase fluids, rest and follow up worse, no better; Stable; refill updated;   No follow-ups on file.  Orders Placed This Encounter  Procedures   POC COVID-19    Order Specific Question:   Previously tested for COVID-19    Answer:   No    Order Specific Question:   Resident in a congregate (group) care setting    Answer:   No    Order Specific Question:   Employed in healthcare setting    Answer:   No    Order Specific Question:   Pregnant    Answer:   No    Requested Prescriptions   Signed Prescriptions Disp Refills   olmesartan (BENICAR) 20 MG  tablet 90 tablet 3    Sig: Take 1 tablet (20 mg total) by mouth daily.

## 2022-11-25 ENCOUNTER — Ambulatory Visit: Payer: BC Managed Care – PPO

## 2022-11-25 ENCOUNTER — Encounter (INDEPENDENT_AMBULATORY_CARE_PROVIDER_SITE_OTHER): Payer: Self-pay

## 2022-12-03 ENCOUNTER — Ambulatory Visit: Payer: BC Managed Care – PPO | Admitting: Bariatrics

## 2022-12-07 ENCOUNTER — Ambulatory Visit: Payer: BC Managed Care – PPO | Admitting: Bariatrics

## 2022-12-07 ENCOUNTER — Encounter: Payer: Self-pay | Admitting: Bariatrics

## 2022-12-07 ENCOUNTER — Other Ambulatory Visit: Payer: Self-pay | Admitting: Bariatrics

## 2022-12-07 VITALS — BP 122/82 | HR 74 | Temp 97.9°F | Ht 61.0 in | Wt 180.0 lb

## 2022-12-07 DIAGNOSIS — E1169 Type 2 diabetes mellitus with other specified complication: Secondary | ICD-10-CM | POA: Diagnosis not present

## 2022-12-07 DIAGNOSIS — E669 Obesity, unspecified: Secondary | ICD-10-CM

## 2022-12-07 DIAGNOSIS — F5089 Other specified eating disorder: Secondary | ICD-10-CM

## 2022-12-07 DIAGNOSIS — Z6834 Body mass index (BMI) 34.0-34.9, adult: Secondary | ICD-10-CM | POA: Diagnosis not present

## 2022-12-07 DIAGNOSIS — Z7985 Long-term (current) use of injectable non-insulin antidiabetic drugs: Secondary | ICD-10-CM

## 2022-12-07 MED ORDER — TIRZEPATIDE 10 MG/0.5ML ~~LOC~~ SOAJ: 10.0000 mg | SUBCUTANEOUS | 0 refills | Status: DC

## 2022-12-07 NOTE — Progress Notes (Signed)
WEIGHT SUMMARY AND BIOMETRICS  Weight Lost Since Last Visit: 1lb  Vitals Temp: 97.9 F (36.6 C) BP: 122/82 Pulse Rate: 74 SpO2: 96 %   Anthropometric Measurements Height: 5\' 1"  (1.549 m) Weight: 180 lb (81.6 kg) BMI (Calculated): 34.03 Weight at Last Visit: 181lb Weight Lost Since Last Visit: 1lb Starting Weight: 187lb Total Weight Loss (lbs): 7 lb (3.175 kg)   Body Composition  Body Fat %: 39.6 % Fat Mass (lbs): 71.6 lbs Muscle Mass (lbs): 103.4 lbs Total Body Water (lbs): 74 lbs Visceral Fat Rating : 9   Other Clinical Data Fasting: yes Labs: no Today's Visit #: 10 Starting Date: 01/12/22    OBESITY Kendra Bennett is here to discuss her progress with her obesity treatment plan along with follow-up of her obesity related diagnoses.     Nutrition Plan: the Category 2 plan - 80% adherence.  Current exercise: walking  Interim History:  She is down 1 lb.  Eating all of the food on the plan., Protein intake is as prescribed, Water intake is inadequate., and Reports excessive cravings.  Pharmacotherapy: Kendra Bennett is on Mounjaro 10 mg SQ weekly Adverse side effects: None Hunger is moderately controlled.  Cravings are moderately controlled.  Assessment/Plan:   Type II Diabetes HgbA1c is at goal, but due to age and health, her HgbA1c could be lower.  Last A1c was 6.9 CBGs: Not checking      Episodes of hypoglycemia: no Medication(s): Mounjaro 7.5 mg SQ weekly  Lab Results  Component Value Date   HGBA1C 6.9 (H) 06/03/2022   HGBA1C 6.6 (H) 01/12/2022   HGBA1C 7.1 (H) 09/05/2021   Lab Results  Component Value Date   LDLCALC 99 01/12/2022   CREATININE 0.56 (L) 06/03/2022   No results found for: "GFR"  Plan: Continue and increase dose Mounjaro 10 mg SQ weekly Continue all other medications.  Will keep all carbohydrates low both sweets and starches.  Will continue exercise regimen to 30 to 60 minutes on most days of the week.  Aim for 7 to 9  hours of sleep nightly.  Eat more low glycemic index foods.    Eating disorder/emotional eating Kendra Bennett has had issues with stress eating and emotional eating. Currently this is moderately controlled. Overall mood is stable. Denies suicidal/homicidal ideation. Medication(s): Mounjaro  Plan:  Behavior modification techniques were discussed today to help deal with emotional/non-hunger eating behaviors. Consider a referral to Dr. Dewaine Conger, PhD psychologist to help with eating behaviors.  Discussed distractions to curb eating behaviors. Discussed activities to do with one's hands in the evening  Be sure to get adequate rest as lack of rest can trigger appetite.  Have plan in place for stressful events.  Consider other rewards besides food.      Generalized Obesity: Current BMI BMI (Calculated): 34.03   Pharmacotherapy Plan Continue and increase dose  Mounjaro 10 mg SQ weekly  Kendra Bennett is currently in the action stage of change. As such, her goal is to continue with weight loss efforts.  She has agreed to the Category 2 plan.  Exercise goals: All adults should avoid inactivity. Some physical activity is better than none, and adults who participate in any amount of physical activity gain some health benefits.  Behavioral modification strategies: increasing lean protein intake, decreasing simple carbohydrates , no meal skipping, meal planning , increase water intake, better snacking choices, planning for success, increasing fiber rich foods, and mindful eating.  Kendra Bennett has agreed to follow-up with our clinic in 4 weeks.  Medications Discontinued During This Encounter  Medication Reason   tirzepatide [facility]) 7.5 MG/0.5ML Pen      Meds ordered this encounter  Medications   tirzepatide (MOUNJARO) 10 MG/0.5ML Pen    Sig: Inject 10 mg into the skin once a week.    Dispense:  2 mL    Refill:  0      Objective:   VITALS: Per patient if applicable, see vitals. GENERAL: Alert  and in no acute distress. CARDIOPULMONARY: No increased WOB. Speaking in clear sentences.  PSYCH: Pleasant and cooperative. Speech normal rate and rhythm. Affect is appropriate. Insight and judgement are appropriate. Attention is focused, linear, and appropriate.  NEURO: Oriented as arrived to appointment on time with no prompting.   Attestation Statements:   This was prepared with the assistance of Engineer, civil (consulting).  Occasional wrong-word or sound-a-like substitutions may have occurred due to the inherent limitations of voice recognition software.   Corinna Capra, DO

## 2022-12-09 DIAGNOSIS — L2084 Intrinsic (allergic) eczema: Secondary | ICD-10-CM | POA: Diagnosis not present

## 2022-12-18 ENCOUNTER — Encounter: Payer: Self-pay | Admitting: Family

## 2022-12-18 ENCOUNTER — Ambulatory Visit (INDEPENDENT_AMBULATORY_CARE_PROVIDER_SITE_OTHER): Payer: BC Managed Care – PPO | Admitting: Family

## 2022-12-18 VITALS — BP 110/64 | HR 67 | Temp 98.0°F | Resp 18 | Ht 61.0 in | Wt 181.6 lb

## 2022-12-18 DIAGNOSIS — Z23 Encounter for immunization: Secondary | ICD-10-CM | POA: Diagnosis not present

## 2022-12-18 DIAGNOSIS — E119 Type 2 diabetes mellitus without complications: Secondary | ICD-10-CM | POA: Diagnosis not present

## 2022-12-18 DIAGNOSIS — E559 Vitamin D deficiency, unspecified: Secondary | ICD-10-CM | POA: Diagnosis not present

## 2022-12-18 DIAGNOSIS — Z13220 Encounter for screening for lipoid disorders: Secondary | ICD-10-CM | POA: Diagnosis not present

## 2022-12-18 DIAGNOSIS — Z0000 Encounter for general adult medical examination without abnormal findings: Secondary | ICD-10-CM | POA: Diagnosis not present

## 2022-12-18 LAB — COMPREHENSIVE METABOLIC PANEL
ALT: 15 U/L (ref 0–35)
AST: 12 U/L (ref 0–37)
Albumin: 4 g/dL (ref 3.5–5.2)
Alkaline Phosphatase: 70 U/L (ref 39–117)
BUN: 14 mg/dL (ref 6–23)
CO2: 25 mEq/L (ref 19–32)
Calcium: 9.2 mg/dL (ref 8.4–10.5)
Chloride: 104 mEq/L (ref 96–112)
Creatinine, Ser: 0.76 mg/dL (ref 0.40–1.20)
GFR: 95.46 mL/min (ref >60.00)
Glucose, Bld: 94 mg/dL (ref 70–99)
Potassium: 4 mEq/L (ref 3.5–5.1)
Sodium: 138 mEq/L (ref 135–145)
Total Bilirubin: 0.6 mg/dL (ref 0.2–1.2)
Total Protein: 7.2 g/dL (ref 6.0–8.3)

## 2022-12-18 LAB — CBC WITH DIFFERENTIAL/PLATELET
Basophils Absolute: 0 10*3/uL (ref 0.0–0.1)
Basophils Relative: 0.4 % (ref 0.0–3.0)
Eosinophils Absolute: 0.2 10*3/uL (ref 0.0–0.7)
Eosinophils Relative: 1.6 % (ref 0.0–5.0)
HCT: 45 % (ref 36.0–46.0)
Hemoglobin: 14.7 g/dL (ref 12.0–15.0)
Lymphocytes Relative: 21.5 % (ref 12.0–46.0)
Lymphs Abs: 2.5 10*3/uL (ref 0.7–4.0)
MCHC: 32.6 g/dL (ref 30.0–36.0)
MCV: 84 fl (ref 78.0–100.0)
Monocytes Absolute: 0.4 10*3/uL (ref 0.1–1.0)
Monocytes Relative: 3.6 % (ref 3.0–12.0)
Neutro Abs: 8.5 10*3/uL — ABNORMAL HIGH (ref 1.4–7.7)
Neutrophils Relative %: 72.9 % (ref 43.0–77.0)
Platelets: 327 10*3/uL (ref 150.0–400.0)
RBC: 5.35 Mil/uL — ABNORMAL HIGH (ref 3.87–5.11)
RDW: 14.2 % (ref 11.5–15.5)
WBC: 11.7 10*3/uL — ABNORMAL HIGH (ref 4.0–10.5)

## 2022-12-18 LAB — LIPID PANEL
Cholesterol: 170 mg/dL (ref 0–200)
HDL: 35.1 mg/dL — ABNORMAL LOW (ref >39.00)
LDL Cholesterol: 111 mg/dL — ABNORMAL HIGH (ref 0–99)
NonHDL: 134.63
Total CHOL/HDL Ratio: 5
Triglycerides: 119 mg/dL (ref 0.0–149.0)
VLDL: 23.8 mg/dL (ref 0.0–40.0)

## 2022-12-18 LAB — HEMOGLOBIN A1C: Hgb A1c MFr Bld: 6.2 % (ref 4.6–6.5)

## 2022-12-18 LAB — VITAMIN D 25 HYDROXY (VIT D DEFICIENCY, FRACTURES): VITD: 62.39 ng/mL (ref 30.00–100.00)

## 2022-12-18 NOTE — Progress Notes (Signed)
Kendra Bennett is a 42 y.o. female with the following history as recorded in EpicCare:  Patient Active Problem List   Diagnosis Date Noted   Generalized obesity 06/03/2022   Fatty liver 06/03/2022   Vitamin D insufficiency 01/26/2022   Other fatigue 01/12/2022   SOB (shortness of breath) on exertion 01/12/2022   PCOS (polycystic ovarian syndrome) 01/12/2022   Essential hypertension 01/12/2022   Diabetes mellitus type 2 in obese 01/12/2022   Health care maintenance 01/12/2022   B12 deficiency 01/12/2022   Vitamin D deficiency 01/12/2022   Class 2 severe obesity with serious comorbidity and body mass index (BMI) of 35.0 to 35.9 in adult (HCC) 01/12/2022   Intrinsic atopic dermatitis 12/04/2020    Current Outpatient Medications  Medication Sig Dispense Refill   cetirizine (ZYRTEC) 10 MG tablet Take 10 mg by mouth daily.     clobetasol ointment (TEMOVATE) 0.05 % Use 2 x/day to thick areas     desonide (DESOWEN) 0.05 % cream Apply to the face 1-2 times a day as needed for atopic dermatitis     DUPIXENT 300 MG/2ML prefilled syringe Inject into the skin.     norethindrone (MICRONOR) 0.35 MG tablet Take 1 tablet (0.35 mg total) by mouth daily. 90 tablet 3   olmesartan (BENICAR) 20 MG tablet Take 1 tablet (20 mg total) by mouth daily. 90 tablet 3   tirzepatide (MOUNJARO) 10 MG/0.5ML Pen Inject 10 mg into the skin once a week. 2 mL 0   VITAMIN D PO Take by mouth.     No current facility-administered medications for this visit.    Allergies: Ciprofloxacin, Penicillins, and Prednisolone  Past Medical History:  Diagnosis Date   Anxiety    Diabetes (HCC)    Eczema    High blood pressure    PCOS (polycystic ovarian syndrome)    Vitamin D deficiency     Past Surgical History:  Procedure Laterality Date   WISDOM TOOTH EXTRACTION      Family History  Problem Relation Age of Onset   High blood pressure Mother    Heart disease Mother    Anxiety disorder Mother    Diabetes Father     High blood pressure Father    High Cholesterol Father    Kidney disease Father     Social History   Tobacco Use   Smoking status: Never   Smokeless tobacco: Not on file  Substance Use Topics   Alcohol use: No    Subjective:   Presents for yearly CPE; does have GYN; working with [facility]- has lost 8 pounds on [place]; scheduled for follow up there next month; needs form completed for her employer that she can be fitted for a respirator;   Review of Systems  Constitutional: Negative.   HENT: Negative.    Eyes: Negative.   Respiratory: Negative.    Cardiovascular: Negative.   Gastrointestinal: Negative.   Genitourinary: Negative.   Musculoskeletal: Negative.   Skin: Negative.   Neurological: Negative.   Endo/Heme/Allergies: Negative.   Psychiatric/Behavioral: Negative.       Objective:  Vitals:   12/18/22 0844  BP: 110/64  Pulse: 67  Resp: 18  Temp: 98 F (36.7 C)  TempSrc: Oral  SpO2: 98%  Weight: 181 lb 9.6 oz (82.4 kg)  Height: 5\' 1"  (1.549 m)    General: Well developed, well nourished, in no acute distress  Skin : Warm and dry.  Head: Normocephalic and atraumatic  Eyes: Sclera and conjunctiva clear; pupils  round and reactive to light; extraocular movements intact  Ears: External normal; canals clear; tympanic membranes normal  Oropharynx: Pink, supple. No suspicious lesions  Neck: Supple without thyromegaly, adenopathy  Lungs: Respirations unlabored; clear to auscultation bilaterally without wheeze, rales, rhonchi  CVS exam: normal rate and regular rhythm.  Abdomen: Soft; nontender; nondistended; normoactive bowel sounds; no masses or hepatosplenomegaly  Musculoskeletal: No deformities; no active joint inflammation  Extremities: No edema, cyanosis, clubbing  Vessels: Symmetric bilaterally  Neurologic: Alert and oriented; speech intact; face symmetrical; moves all extremities well; CNII-XII intact without focal deficit   Assessment:  1.  PE (physical exam), annual   2. Lipid screening   3. Type 2 diabetes mellitus without complication, without long-term current use of insulin (HCC)   4. Vitamin D deficiency   5. Need for Tdap vaccination     Plan:  Age appropriate preventive healthcare needs addressed; encouraged regular eye doctor and dental exams; encouraged regular exercise; will update labs and refills as needed today; follow-up in 1 year since working with [facility] for regular follow ups;  Tdap updated today;   No follow-ups on file.  Orders Placed This Encounter  Procedures   Tdap vaccine greater than or equal to 7yo IM   CBC with Differential/Platelet   Comp Met (CMET)   Lipid panel   Hemoglobin A1c   Vitamin D (25 hydroxy)    Requested Prescriptions    No prescriptions requested or ordered in this encounter

## 2022-12-21 ENCOUNTER — Encounter: Payer: Self-pay | Admitting: Family

## 2022-12-31 ENCOUNTER — Encounter: Payer: Self-pay | Admitting: Family

## 2022-12-31 ENCOUNTER — Ambulatory Visit: Payer: BC Managed Care – PPO | Admitting: Nurse Practitioner

## 2022-12-31 ENCOUNTER — Encounter: Payer: Self-pay | Admitting: Nurse Practitioner

## 2022-12-31 VITALS — BP 101/69 | HR 74 | Temp 98.2°F | Ht 61.0 in | Wt 179.0 lb

## 2022-12-31 DIAGNOSIS — D72829 Elevated white blood cell count, unspecified: Secondary | ICD-10-CM | POA: Diagnosis not present

## 2022-12-31 DIAGNOSIS — E119 Type 2 diabetes mellitus without complications: Secondary | ICD-10-CM | POA: Diagnosis not present

## 2022-12-31 DIAGNOSIS — R7989 Other specified abnormal findings of blood chemistry: Secondary | ICD-10-CM

## 2022-12-31 DIAGNOSIS — Z7985 Long-term (current) use of injectable non-insulin antidiabetic drugs: Secondary | ICD-10-CM

## 2022-12-31 DIAGNOSIS — Z862 Personal history of diseases of the blood and blood-forming organs and certain disorders involving the immune mechanism: Secondary | ICD-10-CM

## 2022-12-31 DIAGNOSIS — E669 Obesity, unspecified: Secondary | ICD-10-CM

## 2022-12-31 DIAGNOSIS — R5383 Other fatigue: Secondary | ICD-10-CM | POA: Diagnosis not present

## 2022-12-31 DIAGNOSIS — E538 Deficiency of other specified B group vitamins: Secondary | ICD-10-CM | POA: Diagnosis not present

## 2022-12-31 DIAGNOSIS — Z6833 Body mass index (BMI) 33.0-33.9, adult: Secondary | ICD-10-CM

## 2022-12-31 MED ORDER — TIRZEPATIDE 10 MG/0.5ML ~~LOC~~ SOAJ
10.0000 mg | SUBCUTANEOUS | 0 refills | Status: DC
Start: 2022-12-31 — End: 2023-01-28

## 2022-12-31 NOTE — Progress Notes (Signed)
Office: [phone]  /  Fax: [phone]  WEIGHT SUMMARY AND BIOMETRICS  Weight Lost Since Last Visit: 1lb  Weight Gained Since Last Visit: 0lb   Vitals Temp: 98.2 F (36.8 C) BP: 101/69 Pulse Rate: 74 SpO2: 97 %   Anthropometric Measurements Height: 5\' 1"  (1.549 m) Weight: 179 lb (81.2 kg) BMI (Calculated): 33.84 Weight at Last Visit: 180lb Weight Lost Since Last Visit: 1lb Weight Gained Since Last Visit: 0lb Starting Weight: 187lb Total Weight Loss (lbs): 8 lb (3.629 kg)   Body Composition  Body Fat %: 39.4 % Fat Mass (lbs): 70.6 lbs Muscle Mass (lbs): 103 lbs Total Body Water (lbs): 74.6 lbs Visceral Fat Rating : 9   Other Clinical Data Fasting: no Labs: no Today's Visit #: 11 Starting Date: 01/12/22     HPI  Chief Complaint: OBESITY  Kendra Bennett is here to discuss her progress with her obesity treatment plan. She is on the the Category 2 Plan and states she is following her eating plan approximately 90 % of the time. She states she is exercising 45 minutes 5 days per week.   Interval History:  Since last office visit she has lost 1 pound.  She is not skipping meals.  She is meeting protein goals.  Struggling with water intake. She is drinking unsweetened tea with Stevia, occ Coke Zero and sometimes an energy drink.    Goal weight:  160 lbs   Pharmacotherapy for weight loss: She is not currently taking medications  for medical weight loss.      Previous pharmacotherapy for medical weight loss:  None  Bariatric surgery:  Patient has not had bariatric surgery.    Pharmacotherapy for DMT2:  She is currently taking Mounjaro 10mg .  Denies side effects.   Last A1c was 6.2 She is not checking BS at home.   Episodes of hypoglycemia: no On ARB, ASA 81mg   Last eye exam:  2024 She has tried Trulicity in the past.    Lab Results  Component Value Date   HGBA1C 6.2 12/18/2022   HGBA1C 6.9 (H) 06/03/2022   HGBA1C 6.6 (H) 01/12/2022   Lab Results   Component Value Date   LDLCALC 111 (H) 12/18/2022   CREATININE 0.76 12/18/2022    Leukocytosis Last labs were 12/18/22.  PCP asked patient to ask me to recheck her labs.  She reports fatigue and overall not feeling well.  Reports she felt she had a sinus infection the week she had labs obtained. Denies fever, chills, nausea, vomiting or diarrhea.   Low Vit B12 Never taking Vit B12 injections in the past.  Takes Vit B12 occasionally but not on a regular basis. Reports fatigue.   History of anemia Not taking a MV or iron.  Has taken iron in the past for anemia.  Never had iron infusions. Reports fatigue.    PHYSICAL EXAM:  Blood pressure 101/69, pulse 74, temperature 98.2 F (36.8 C), height 5\' 1"  (1.549 m), weight 179 lb (81.2 kg), last menstrual period 11/26/2022, SpO2 97%. Body mass index is 33.82 kg/m.  General: She is overweight, cooperative, alert, well developed, and in no acute distress. PSYCH: Has normal mood, affect and thought process.   Extremities: No edema.  Neurologic: No gross sensory or motor deficits. No tremors or fasciculations noted.    DIAGNOSTIC DATA REVIEWED:  BMET    Component Value Date/Time   NA 138 12/18/2022 0932   NA 142 06/03/2022 1600   K 4.0 12/18/2022 0932   CL 104  12/18/2022 0932   CO2 25 12/18/2022 0932   GLUCOSE 94 12/18/2022 0932   BUN 14 12/18/2022 0932   BUN 7 06/03/2022 1600   CREATININE 0.76 12/18/2022 0932   CREATININE 0.61 09/05/2021 1453   CALCIUM 9.2 12/18/2022 0932   GFRNONAA >60 08/04/2015 1325   GFRAA >60 08/04/2015 1325   Lab Results  Component Value Date   HGBA1C 6.2 12/18/2022   HGBA1C 7.1 (H) 09/05/2021   Lab Results  Component Value Date   INSULIN 23.1 01/12/2022   Lab Results  Component Value Date   TSH 3.020 01/12/2022   CBC    Component Value Date/Time   WBC 11.7 (H) 12/18/2022 0932   RBC 5.35 (H) 12/18/2022 0932   HGB 14.7 12/18/2022 0932   HCT 45.0 12/18/2022 0932   PLT 327.0 12/18/2022 0932    MCV 84.0 12/18/2022 0932   MCH 27.7 09/05/2021 1453   MCHC 32.6 12/18/2022 0932   RDW 14.2 12/18/2022 0932   Iron Studies No results found for: "IRON", "TIBC", "FERRITIN", "IRONPCTSAT" Lipid Panel     Component Value Date/Time   CHOL 170 12/18/2022 0932   CHOL 170 01/12/2022 0856   TRIG 119.0 12/18/2022 0932   HDL 35.10 (L) 12/18/2022 0932   HDL 48 01/12/2022 0856   CHOLHDL 5 12/18/2022 0932   VLDL 23.8 12/18/2022 0932   LDLCALC 111 (H) 12/18/2022 0932   LDLCALC 99 01/12/2022 0856   Hepatic Function Panel     Component Value Date/Time   PROT 7.2 12/18/2022 0932   PROT 6.9 06/03/2022 1600   ALBUMIN 4.0 12/18/2022 0932   ALBUMIN 4.2 06/03/2022 1600   AST 12 12/18/2022 0932   ALT 15 12/18/2022 0932   ALKPHOS 70 12/18/2022 0932   BILITOT 0.6 12/18/2022 0932   BILITOT 0.4 06/03/2022 1600      Component Value Date/Time   TSH 3.020 01/12/2022 0856   Nutritional Lab Results  Component Value Date   VD25OH 62.39 12/18/2022   VD25OH 40.8 06/03/2022   VD25OH 21.3 (L) 01/12/2022     ASSESSMENT AND PLAN  TREATMENT PLAN FOR OBESITY:  Recommended Dietary Goals  Adrian is currently in the action stage of change. As such, her goal is to continue weight management plan. She has agreed to the Category 2 Plan.  Behavioral Intervention  We discussed the following Behavioral Modification Strategies today: increasing lean protein intake, decreasing simple carbohydrates , increasing vegetables, increasing lower glycemic fruits, increasing water intake, work on meal planning and preparation, reading food labels , continue to practice mindfulness when eating, and planning for success.  Additional resources provided today: NA  Recommended Physical Activity Goals  Kendra Bennett has been advised to work up to 150 minutes of moderate intensity aerobic activity a week and strengthening exercises 2-3 times per week for cardiovascular health, weight loss maintenance and preservation of  muscle mass.   She has agreed to Think about ways to increase daily physical activity and overcoming barriers to exercise and Increase physical activity in their day and reduce sedentary time (increase NEAT).    ASSOCIATED CONDITIONS ADDRESSED TODAY  Action/Plan  Leukocytosis, unspecified type -     CBC with Differential/Platelet  Will continue to monitor.  To follow up with PCP.   Type 2 diabetes mellitus without complication, without long-term current use of insulin (HCC) -     Tirzepatide; Inject 10 mg into the skin once a week.  Dispense: 2 mL; Refill: 0  Good blood sugar control is important to decrease the  likelihood of diabetic complications such as nephropathy, neuropathy, limb loss, blindness, coronary artery disease, and death. Intensive lifestyle modification including diet, exercise and weight loss are the first line of treatment for diabetes.    Low vitamin B12 level -     Vitamin B12  Other fatigue -     Vitamin B12 -     TSH -     CBC with Differential/Platelet -     Ferritin -     Iron  History of anemia Labs obtained today  BMI 33.0-33.9,adult  Generalized obesity -     TSH         Return in about 4 weeks (around 01/28/2023).Marland Kitchen She was informed of the importance of frequent follow up visits to maximize her success with intensive lifestyle modifications for her multiple health conditions.   ATTESTASTION STATEMENTS:  Reviewed by clinician on day of visit: allergies, medications, problem list, medical history, surgical history, family history, social history, and previous encounter notes.    Theodis Sato. Mandell Pangborn FNP-C

## 2023-01-01 ENCOUNTER — Encounter: Payer: Self-pay | Admitting: Family

## 2023-01-01 LAB — CBC WITH DIFFERENTIAL/PLATELET
Basophils Absolute: 0 x10E3/uL (ref 0.0–0.2)
Basos: 1 %
EOS (ABSOLUTE): 0.2 x10E3/uL (ref 0.0–0.4)
Eos: 3 %
Hematocrit: 44.7 % (ref 34.0–46.6)
Hemoglobin: 14.6 g/dL (ref 11.1–15.9)
Immature Grans (Abs): 0.1 x10E3/uL (ref 0.0–0.1)
Immature Granulocytes: 1 %
Lymphocytes Absolute: 2.1 x10E3/uL (ref 0.7–3.1)
Lymphs: 23 %
MCH: 27.9 pg (ref 26.6–33.0)
MCHC: 32.7 g/dL (ref 31.5–35.7)
MCV: 86 fL (ref 79–97)
Monocytes Absolute: 0.4 x10E3/uL (ref 0.1–0.9)
Monocytes: 4 %
Neutrophils Absolute: 6 x10E3/uL (ref 1.4–7.0)
Neutrophils: 68 %
Platelets: 294 x10E3/uL (ref 150–450)
RBC: 5.23 x10E6/uL (ref 3.77–5.28)
RDW: 13.3 % (ref 11.7–15.4)
WBC: 8.9 x10E3/uL (ref 3.4–10.8)

## 2023-01-01 LAB — VITAMIN B12: Vitamin B-12: 869 pg/mL (ref 232–1245)

## 2023-01-01 LAB — TSH: TSH: 1.91 u[IU]/mL (ref 0.450–4.500)

## 2023-01-01 LAB — IRON: Iron: 53 ug/dL (ref 27–159)

## 2023-01-01 LAB — FERRITIN: Ferritin: 43 ng/mL (ref 15–150)

## 2023-01-28 ENCOUNTER — Ambulatory Visit: Payer: BC Managed Care – PPO | Admitting: Nurse Practitioner

## 2023-01-28 ENCOUNTER — Encounter: Payer: Self-pay | Admitting: Nurse Practitioner

## 2023-01-28 VITALS — BP 100/68 | HR 62 | Temp 98.1°F | Ht 61.0 in | Wt 180.0 lb

## 2023-01-28 DIAGNOSIS — Z6834 Body mass index (BMI) 34.0-34.9, adult: Secondary | ICD-10-CM

## 2023-01-28 DIAGNOSIS — E669 Obesity, unspecified: Secondary | ICD-10-CM

## 2023-01-28 DIAGNOSIS — R7989 Other specified abnormal findings of blood chemistry: Secondary | ICD-10-CM | POA: Diagnosis not present

## 2023-01-28 DIAGNOSIS — E119 Type 2 diabetes mellitus without complications: Secondary | ICD-10-CM | POA: Diagnosis not present

## 2023-01-28 DIAGNOSIS — R638 Other symptoms and signs concerning food and fluid intake: Secondary | ICD-10-CM

## 2023-01-28 DIAGNOSIS — Z7985 Long-term (current) use of injectable non-insulin antidiabetic drugs: Secondary | ICD-10-CM

## 2023-01-28 DIAGNOSIS — D72829 Elevated white blood cell count, unspecified: Secondary | ICD-10-CM | POA: Diagnosis not present

## 2023-01-28 MED ORDER — TIRZEPATIDE 10 MG/0.5ML ~~LOC~~ SOAJ: 10.0000 mg | SUBCUTANEOUS | 0 refills | Status: DC

## 2023-01-28 MED ORDER — BUPROPION HCL ER (SR) 150 MG PO TB12
150.0000 mg | ORAL_TABLET | Freq: Every day | ORAL | 0 refills | Status: DC
Start: 2023-01-28 — End: 2023-02-18

## 2023-01-28 NOTE — Progress Notes (Signed)
Office: [phone]  /  Fax: [phone]  WEIGHT SUMMARY AND BIOMETRICS  Weight Lost Since Last Visit: 0lb  Weight Gained Since Last Visit: 1lb   Vitals Temp: 98.1 F (36.7 C) BP: 100/68 Pulse Rate: 62 SpO2: 96 %   Anthropometric Measurements Height: 5\' 1"  (1.549 m) Weight: 180 lb (81.6 kg) BMI (Calculated): 34.03 Weight at Last Visit: 179lb Weight Lost Since Last Visit: 0lb Weight Gained Since Last Visit: 1lb Starting Weight: 187lb Total Weight Loss (lbs): 7 lb (3.175 kg)   Body Composition  Body Fat %: 39.7 % Fat Mass (lbs): 71.6 lbs Muscle Mass (lbs): 103.2 lbs Total Body Water (lbs): 74.2 lbs Visceral Fat Rating : 9   Other Clinical Data Fasting: No Labs: No Today's Visit #: 12 Starting Date: 01/12/22     HPI  Chief Complaint: OBESITY  Kendra Bennett is here to discuss her progress with her obesity treatment plan. She is on the the Category 2 Plan and states she is following her eating plan approximately 80 % of the time. She states she is exercising 20 minutes 2 days per week.   Interval History:  Since last office visit she has gained 1 pound. She is struggling with cravings and has been eating more sweets and ice cream.  She struggles to exercise due to pain.  Feels that the pain is coming from Dupixent.  Plans to discuss with dermatologist.  She is drinking water, sweet tea and coke zero.     Pharmacotherapy for weight loss: She is not currently taking medications  for medical weight loss.    Previous pharmacotherapy for medical weight loss:  None  Bariatric surgery:  Has not had bariatric surgery  Pharmacotherapy for DMT2:  She is taking Mounjaro 10mg .  Denies side effects.   Last A1c was 6.2 on 12/18/22 She is not checking BS at home.   Episodes of hypoglycemia: no On ARB, ASA 81mg   Last eye exam:  2024 She has tried Trulicity in the past.  Denies polyphagia     Leukocytosis Resolved.  Last labs 12/31/22  Low Vit B12 Last labs on  12/31/22 looked better at 869.  Taking Vit B12 OTC.    PHYSICAL EXAM:  Blood pressure 100/68, pulse 62, temperature 98.1 F (36.7 C), height 5\' 1"  (1.549 m), weight 180 lb (81.6 kg), SpO2 96%. Body mass index is 34.01 kg/m.  General: She is overweight, cooperative, alert, well developed, and in no acute distress. PSYCH: Has normal mood, affect and thought process.   Extremities: No edema.  Neurologic: No gross sensory or motor deficits. No tremors or fasciculations noted.    DIAGNOSTIC DATA REVIEWED:  BMET    Component Value Date/Time   NA 138 12/18/2022 0932   NA 142 06/03/2022 1600   K 4.0 12/18/2022 0932   CL 104 12/18/2022 0932   CO2 25 12/18/2022 0932   GLUCOSE 94 12/18/2022 0932   BUN 14 12/18/2022 0932   BUN 7 06/03/2022 1600   CREATININE 0.76 12/18/2022 0932   CREATININE 0.61 09/05/2021 1453   CALCIUM 9.2 12/18/2022 0932   GFRNONAA >60 08/04/2015 1325   GFRAA >60 08/04/2015 1325   Lab Results  Component Value Date   HGBA1C 6.2 12/18/2022   HGBA1C 7.1 (H) 09/05/2021   Lab Results  Component Value Date   INSULIN 23.1 01/12/2022   Lab Results  Component Value Date   TSH 1.910 12/31/2022   CBC    Component Value Date/Time   WBC 8.9 12/31/2022 0901  WBC 11.7 (H) 12/18/2022 0932   RBC 5.23 12/31/2022 0901   RBC 5.35 (H) 12/18/2022 0932   HGB 14.6 12/31/2022 0901   HCT 44.7 12/31/2022 0901   PLT 294 12/31/2022 0901   MCV 86 12/31/2022 0901   MCH 27.9 12/31/2022 0901   MCH 27.7 09/05/2021 1453   MCHC 32.7 12/31/2022 0901   MCHC 32.6 12/18/2022 0932   RDW 13.3 12/31/2022 0901   Iron Studies    Component Value Date/Time   IRON 53 12/31/2022 0901   FERRITIN 43 12/31/2022 0901   Lipid Panel     Component Value Date/Time   CHOL 170 12/18/2022 0932   CHOL 170 01/12/2022 0856   TRIG 119.0 12/18/2022 0932   HDL 35.10 (L) 12/18/2022 0932   HDL 48 01/12/2022 0856   CHOLHDL 5 12/18/2022 0932   VLDL 23.8 12/18/2022 0932   LDLCALC 111 (H) 12/18/2022  0932   LDLCALC 99 01/12/2022 0856   Hepatic Function Panel     Component Value Date/Time   PROT 7.2 12/18/2022 0932   PROT 6.9 06/03/2022 1600   ALBUMIN 4.0 12/18/2022 0932   ALBUMIN 4.2 06/03/2022 1600   AST 12 12/18/2022 0932   ALT 15 12/18/2022 0932   ALKPHOS 70 12/18/2022 0932   BILITOT 0.6 12/18/2022 0932   BILITOT 0.4 06/03/2022 1600      Component Value Date/Time   TSH 1.910 12/31/2022 0901   Nutritional Lab Results  Component Value Date   VD25OH 62.39 12/18/2022   VD25OH 40.8 06/03/2022   VD25OH 21.3 (L) 01/12/2022     ASSESSMENT AND PLAN  TREATMENT PLAN FOR OBESITY:  Recommended Dietary Goals  Arlenis is currently in the action stage of change. As such, her goal is to continue weight management plan. She has agreed to keeping a food journal and adhering to recommended goals of 1200-1300 calories and 75+ protein.  Behavioral Intervention  We discussed the following Behavioral Modification Strategies today: increasing lean protein intake, decreasing simple carbohydrates , increasing vegetables, increasing lower glycemic fruits, increasing water intake, work on tracking and journaling calories using tracking application, continue to practice mindfulness when eating, and planning for success.  Additional resources provided today: NA  Recommended Physical Activity Goals  Keniyah has been advised to work up to 150 minutes of moderate intensity aerobic activity a week and strengthening exercises 2-3 times per week for cardiovascular health, weight loss maintenance and preservation of muscle mass.   She has agreed to Think about ways to increase daily physical activity and overcoming barriers to exercise, Increase the intensity, frequency or duration of strengthening exercises , Increase the intensity, frequency or duration of aerobic exercises  , Increase physical activity in their day and reduce sedentary time (increase NEAT)., and Work on scheduling and tracking  physical activity.    Pharmacotherapy We discussed various medication options to help Placida with her weight loss efforts.  Today discussed:  Wellbutrin and Topamax for cravings.   ASSOCIATED CONDITIONS ADDRESSED TODAY  Action/Plan  Type 2 diabetes mellitus without complication, without long-term current use of insulin (HCC) -     Continue Tirzepatide; Inject 10 mg into the skin once a week.  Dispense: 2 mL; Refill: 0  Leukocytosis, unspecified type Resolved  Low vitamin B12 level Continue Vit B12 OTC  Abnormal craving -     Start buPROPion HCl ER (SR); Take 1 tablet (150 mg total) by mouth daily.  Dispense: 30 tablet; Refill: 0.  Side effects discussed.   Generalized obesity  BMI  34.0-34.9,adult       Labs reviewed in chart with patient from 12/31/22  Return in about 4 weeks (around 02/25/2023).Marland Kitchen She was informed of the importance of frequent follow up visits to maximize her success with intensive lifestyle modifications for her multiple health conditions.   ATTESTASTION STATEMENTS:  Reviewed by clinician on day of visit: allergies, medications, problem list, medical history, surgical history, family history, social history, and previous encounter notes.    Theodis Sato. Janavia Rottman FNP-C

## 2023-02-11 ENCOUNTER — Encounter: Payer: Self-pay | Admitting: Family

## 2023-02-13 ENCOUNTER — Encounter: Payer: Self-pay | Admitting: Nurse Practitioner

## 2023-02-18 ENCOUNTER — Ambulatory Visit: Payer: BC Managed Care – PPO | Admitting: Medical

## 2023-02-18 ENCOUNTER — Encounter: Payer: Self-pay | Admitting: Medical

## 2023-02-18 VITALS — BP 104/53 | HR 57 | Temp 98.9°F | Resp 16 | Ht 61.0 in | Wt 184.0 lb

## 2023-02-18 DIAGNOSIS — M26609 Unspecified temporomandibular joint disorder, unspecified side: Secondary | ICD-10-CM | POA: Diagnosis not present

## 2023-02-18 DIAGNOSIS — H6992 Unspecified Eustachian tube disorder, left ear: Secondary | ICD-10-CM

## 2023-02-18 DIAGNOSIS — K0889 Other specified disorders of teeth and supporting structures: Secondary | ICD-10-CM

## 2023-02-18 DIAGNOSIS — H538 Other visual disturbances: Secondary | ICD-10-CM | POA: Diagnosis not present

## 2023-02-18 DIAGNOSIS — E119 Type 2 diabetes mellitus without complications: Secondary | ICD-10-CM | POA: Diagnosis not present

## 2023-02-18 MED ORDER — FLUTICASONE PROPIONATE 50 MCG/ACT NA SUSP: 2.0000 | Freq: Every day | NASAL | 1 refills | Status: AC

## 2023-02-18 NOTE — Progress Notes (Signed)
Subjective:    Patient ID: Kendra Bennett, female    DOB: November 23, 1980, 42 y.o.   MRN: [id number]  HPI Discussed the use of AI scribe software for clinical note transcription with the patient, who gave verbal consent to proceed.  History of Present Illness   The patient, with a history of allergic rhinitis, has been experiencing ear discomfort for approximately four weeks. Initially, the patient did not find the discomfort bothersome, but it has persisted and occasionally presents with mild pain pressure. The patient denies any recent congestion or allergy symptoms and has been taking Zyrtec almost daily. 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  In addition to the ear discomfort, the patient has noticed tenderness in the left temporomandibular joint (TMJ) area. The patient has a history of right TMJ irritation but has not previously experienced issues on the left side.  The patient also reports dental issues, including a cracked tooth and a cavity on the left lower side, and a filling that needs to be done on an upper tooth. The patient has been waiting for a temporary crown for the cracked tooth for 5 days and has an appointment scheduled for the procedure today. The patient has noticed increased sensitivity in the tooth since it was cleaned and has been unable to chew on the left side as usual. The patient suspects that the tooth fracture may have been caused by cracking ice. The dental issues have not been associated with any infection or antibiotic treatment as of yet.                Review of Systems  Constitutional:  Negative for chills and fever.  HENT:  Positive for ear pain. Negative for congestion.        Tmj pain and tooth discomfort.  Respiratory:  Negative for cough, chest tightness and wheezing.   Cardiovascular:  Negative for chest pain and palpitations.  Gastrointestinal:  Negative for  abdominal pain, nausea and vomiting.  Genitourinary:  Negative for dysuria.  Musculoskeletal:  Negative for back pain.  Skin:  Negative for rash.  Neurological:  Negative for dizziness, speech difficulty, weakness and light-headedness.  Hematological:  Negative for adenopathy. Does not bruise/bleed easily.  Psychiatric/Behavioral:  Negative for behavioral problems, decreased concentration and sleep disturbance. The patient is not nervous/anxious.     Past Medical History:  Diagnosis Date   Anxiety    Diabetes (HCC)    Eczema    High blood pressure    PCOS (polycystic ovarian syndrome)    Vitamin D deficiency      Social History   Socioeconomic History   Marital status: Married    Spouse name: Not on file   Number of children: Not on file   Years of education: Not on file   Highest education level: Doctorate  Occupational History   Not on file  Tobacco Use   Smoking status: Never   Smokeless tobacco: Not on file  Vaping Use   Vaping status: Never Used  Substance and Sexual Activity   Alcohol use: No   Drug use: No   Sexual activity: Not Currently  Other Topics Concern   Not on file  Social History Narrative   Not on file   Social Determinants of Health   Financial Resource Strain: Low Risk  (11/20/2022)   Overall Financial Resource Strain (CARDIA)    Difficulty of Paying Living Expenses: Not hard  at all  Food Insecurity: No Food Insecurity (11/20/2022)   Hunger Vital Sign    Worried About Running Out of Food in the Last Year: Never true    Ran Out of Food in the Last Year: Never true  Transportation Needs: No Transportation Needs (11/20/2022)   PRAPARE - Administrator, Civil Service (Medical): No    Lack of Transportation (Non-Medical): No  Physical Activity: Insufficiently Active (11/20/2022)   Exercise Vital Sign    Days of Exercise per Week: 2 days    Minutes of Exercise per Session: 20 min  Stress: No Stress Concern Present (11/20/2022)   [employer] of Occupational Health - Occupational Stress Questionnaire    Feeling of Stress : Not at all  Social Connections: Socially Integrated (11/20/2022)   Social Connection and Isolation Panel [NHANES]    Frequency of Communication with Friends and Family: More than three times a week    Frequency of Social Gatherings with Friends and Family: Once a week    Attends Religious Services: More than 4 times per year    Active Member of [employer] or Organizations: Yes    Attends Engineer, structural: More than 4 times per year    Marital Status: Married  Catering manager Violence: Not on file    Past Surgical History:  Procedure Laterality Date   WISDOM TOOTH EXTRACTION      Family History  Problem Relation Age of Onset   High blood pressure Mother    Heart disease Mother    Anxiety disorder Mother    Diabetes Father    High blood pressure Father    High Cholesterol Father    Kidney disease Father     Allergies  Allergen Reactions   Ciprofloxacin Other (See Comments)   Penicillins Rash   Prednisolone Rash    Current Outpatient Medications on File Prior to Visit  Medication Sig Dispense Refill   cetirizine (ZYRTEC) 10 MG tablet Take 10 mg by mouth daily.     clobetasol ointment (TEMOVATE) 0.05 % Use 2 x/day to thick areas     desonide (DESOWEN) 0.05 % cream Apply to the face 1-2 times a day as needed for atopic dermatitis     DUPIXENT 300 MG/2ML prefilled syringe Inject into the skin.     norethindrone (MICRONOR) 0.35 MG tablet Take 1 tablet (0.35 mg total) by mouth daily. 90 tablet 3   olmesartan (BENICAR) 20 MG tablet Take 1 tablet (20 mg total) by mouth daily. 90 tablet 3   tirzepatide (MOUNJARO) 10 MG/0.5ML Pen Inject 10 mg into the skin once a week. 2 mL 0   VITAMIN D PO Take by mouth.     No current facility-administered medications on file prior to visit.    BP (!) 104/53 (BP Location: Right Arm, Patient Position: Sitting, Cuff Size: Small)   Pulse (!) 57    Temp 98.9 F (37.2 C) (Oral)   Resp 16   Wt 184 lb (83.5 kg)   LMP  (LMP Unknown)   SpO2 99%   BMI 34.77 kg/m        Objective:   Physical Exam  General- No acute distress. Pleasant patient. Neck- Full range of motion, no jvd Lungs- Clear, even and unlabored. Heart- regular rate and rhythm. Neurologic- CNII- XII grossly intact.  Heent- canal clear. No tragal tenderness. Canal not swollen. No mastoid pain. Tm normal. Left tmj are mild tender to palpation. Jaw line left side not tender.  Assessment & Plan:  Assessment and Plan    Eustachian Tube Dysfunction Ear fullness for 4 weeks with intermittent mild pain. No hearing loss. History of allergies and currently on Zyrtec. No recent upper respiratory infection symptoms. -Continue Zyrtec daily. -Start Flonase nasal spray, two sprays each nostril once daily.  Temporomandibular Joint (TMJ) Dysfunction Tenderness over the left TMJ area. No history of TMJ pain on the left side. -Advise low dose ibuprofen 200-400mg  every 8 hours as needed. -Recommend soft diet and avoidance of crunchy foods.  Dental Issues Cracked tooth and cavity on the left side. Dental procedures scheduled for today. No recent antibiotics and no signs of infection on recent dental x-rays. -Advise patient to discuss need for antibiotics with dentist today.(verify not needed. they may repeat xray to determine) -Consider prescribing antibiotics if pain worsens after dental procedures.  Follow-up in 10-14 days with Vernona Rieger NP pcp  to assess resolution of ear symptoms and TMJ pain. Sooner if needed.   Esperanza Richters, PA-C

## 2023-02-18 NOTE — Patient Instructions (Addendum)
Eustachian Tube Dysfunction Ear fullness for 4 weeks with intermittent mild pain. No hearing loss. History of allergies and currently on Zyrtec. No recent upper respiratory infection symptoms. -Continue Zyrtec daily. -Start Flonase nasal spray, two sprays each nostril once daily.  Temporomandibular Joint (TMJ) Dysfunction Tenderness over the left TMJ area. No history of TMJ pain on the left side. -Advise low dose ibuprofen 200-400mg  every 8 hours as needed. -Recommend soft diet and avoidance of crunchy foods.  Dental Issues Cracked tooth and cavity on the left side. Dental procedures scheduled for today. No recent antibiotics and no signs of infection on recent dental x-rays. -Advise patient to discuss need for antibiotics with dentist today.(verify not needed. they may repeat xray to determine) -Consider prescribing antibiotics if pain worsens after dental procedures.  Follow-up in 10-14 days with Vernona Rieger NP pcp  to assess resolution of ear symptoms and TMJ pain. Sooner if needed.  Please send update by chart whether dentist prescribe antibiotic.

## 2023-02-25 ENCOUNTER — Ambulatory Visit: Payer: BC Managed Care – PPO | Admitting: Nurse Practitioner

## 2023-02-25 ENCOUNTER — Encounter: Payer: Self-pay | Admitting: Nurse Practitioner

## 2023-02-25 VITALS — BP 130/80 | HR 68 | Temp 98.0°F | Ht 61.0 in | Wt 179.0 lb

## 2023-02-25 DIAGNOSIS — E669 Obesity, unspecified: Secondary | ICD-10-CM

## 2023-02-25 DIAGNOSIS — Z6833 Body mass index (BMI) 33.0-33.9, adult: Secondary | ICD-10-CM

## 2023-02-25 DIAGNOSIS — E119 Type 2 diabetes mellitus without complications: Secondary | ICD-10-CM

## 2023-02-25 DIAGNOSIS — Z7985 Long-term (current) use of injectable non-insulin antidiabetic drugs: Secondary | ICD-10-CM

## 2023-02-25 DIAGNOSIS — E65 Localized adiposity: Secondary | ICD-10-CM | POA: Diagnosis not present

## 2023-02-25 MED ORDER — TIRZEPATIDE 12.5 MG/0.5ML ~~LOC~~ SOAJ
12.5000 mg | SUBCUTANEOUS | 0 refills | Status: DC
Start: 2023-02-25 — End: 2023-04-01

## 2023-02-25 NOTE — Progress Notes (Signed)
Office: [phone]  /  Fax: [phone]  WEIGHT SUMMARY AND BIOMETRICS  Weight Lost Since Last Visit: 1lb  Weight Gained Since Last Visit: 0lb   Vitals Temp: 98 F (36.7 C) BP: 130/80 Pulse Rate: 68 SpO2: 98 %   Anthropometric Measurements Height: 5\' 1"  (1.549 m) Weight: 179 lb (81.2 kg) BMI (Calculated): 33.84 Weight at Last Visit: 180lb Weight Lost Since Last Visit: 1lb Weight Gained Since Last Visit: 0lb Starting Weight: 187lb Total Weight Loss (lbs): 8 lb (3.629 kg)   Body Composition  Body Fat %: 39.6 % Fat Mass (lbs): 71.2 lbs Muscle Mass (lbs): 103 lbs Total Body Water (lbs): 75 lbs Visceral Fat Rating : 9   Other Clinical Data Fasting: Yes Labs: No Today's Visit #: 13 Starting Date: 01/12/22     HPI  Chief Complaint: OBESITY  Kendra Bennett is here to discuss her progress with her obesity treatment plan. She is on the the Category 2 Plan and states she is following her eating plan approximately 90 % of the time. She states she is exercising 150 minutes per week.   Interval History:  Since last office visit she has lost 1 pound.  She is not skipping meals and is aiming to eat a protein with each meal.  She is being more active-walking more.  She is walking around 150 minutes/week.  Notes some cravings and polyphagia.     Pharmacotherapy for weight loss: She is not currently taking medications  for medical weight loss.    I had prescribed her Wellbutrin after her last visit.  She stopped it after 2 weeks due to blurry vision.   She followed up with her eye doctor and was told that her exam was wnl.  She did feel better, had more energy while taking it but has decided not to restart taking it.     Previous pharmacotherapy for medical weight loss:  None  Bariatric surgery:  Patient has not had bariatric surgery.   Pharmacotherapy for DMT2:  She is currently taking Mounjaro 10mg .  Denies side effects.   Last A1c was 6.2 She is not checking BS at  home.   Episodes of hypoglycemia: no On ARB, ASA 81mg   Last eye exam:  10/24 She has tried Trulicity in the past.    Lab Results  Component Value Date   HGBA1C 6.2 12/18/2022   HGBA1C 6.9 (H) 06/03/2022   HGBA1C 6.6 (H) 01/12/2022   Lab Results  Component Value Date   LDLCALC 111 (H) 12/18/2022   CREATININE 0.76 12/18/2022    Abdominal pannus Struggling with abd pannus for 4+ years.  Using OTC meds for redness, irritation, yeast.     PHYSICAL EXAM:  Blood pressure 130/80, pulse 68, temperature 98 F (36.7 C), height 5\' 1"  (1.549 m), weight 179 lb (81.2 kg), SpO2 98%. Body mass index is 33.82 kg/m.  General: She is overweight, cooperative, alert, well developed, and in no acute distress. PSYCH: Has normal mood, affect and thought process.   Extremities: No edema.  Neurologic: No gross sensory or motor deficits. No tremors or fasciculations noted.    DIAGNOSTIC DATA REVIEWED:  BMET    Component Value Date/Time   NA 138 12/18/2022 0932   NA 142 06/03/2022 1600   K 4.0 12/18/2022 0932   CL 104 12/18/2022 0932   CO2 25 12/18/2022 0932   GLUCOSE 94 12/18/2022 0932   BUN 14 12/18/2022 0932   BUN 7 06/03/2022 1600   CREATININE 0.76 12/18/2022 0932  CREATININE 0.61 09/05/2021 1453   CALCIUM 9.2 12/18/2022 0932   GFRNONAA >60 08/04/2015 1325   GFRAA >60 08/04/2015 1325   Lab Results  Component Value Date   HGBA1C 6.2 12/18/2022   HGBA1C 7.1 (H) 09/05/2021   Lab Results  Component Value Date   INSULIN 23.1 01/12/2022   Lab Results  Component Value Date   TSH 1.910 12/31/2022   CBC    Component Value Date/Time   WBC 8.9 12/31/2022 0901   WBC 11.7 (H) 12/18/2022 0932   RBC 5.23 12/31/2022 0901   RBC 5.35 (H) 12/18/2022 0932   HGB 14.6 12/31/2022 0901   HCT 44.7 12/31/2022 0901   PLT 294 12/31/2022 0901   MCV 86 12/31/2022 0901   MCH 27.9 12/31/2022 0901   MCH 27.7 09/05/2021 1453   MCHC 32.7 12/31/2022 0901   MCHC 32.6 12/18/2022 0932   RDW 13.3  12/31/2022 0901   Iron Studies    Component Value Date/Time   IRON 53 12/31/2022 0901   FERRITIN 43 12/31/2022 0901   Lipid Panel     Component Value Date/Time   CHOL 170 12/18/2022 0932   CHOL 170 01/12/2022 0856   TRIG 119.0 12/18/2022 0932   HDL 35.10 (L) 12/18/2022 0932   HDL 48 01/12/2022 0856   CHOLHDL 5 12/18/2022 0932   VLDL 23.8 12/18/2022 0932   LDLCALC 111 (H) 12/18/2022 0932   LDLCALC 99 01/12/2022 0856   Hepatic Function Panel     Component Value Date/Time   PROT 7.2 12/18/2022 0932   PROT 6.9 06/03/2022 1600   ALBUMIN 4.0 12/18/2022 0932   ALBUMIN 4.2 06/03/2022 1600   AST 12 12/18/2022 0932   ALT 15 12/18/2022 0932   ALKPHOS 70 12/18/2022 0932   BILITOT 0.6 12/18/2022 0932   BILITOT 0.4 06/03/2022 1600      Component Value Date/Time   TSH 1.910 12/31/2022 0901   Nutritional Lab Results  Component Value Date   VD25OH 62.39 12/18/2022   VD25OH 40.8 06/03/2022   VD25OH 21.3 (L) 01/12/2022     ASSESSMENT AND PLAN  TREATMENT PLAN FOR OBESITY:  Recommended Dietary Goals  Kendra Bennett is currently in the action stage of change. As such, her goal is to continue weight management plan. She has agreed to the Category 2 Plan.  Behavioral Intervention  We discussed the following Behavioral Modification Strategies today: work on tracking and journaling calories using tracking application and continue to work on maintaining a reduced calorie state, getting the recommended amount of protein, incorporating whole foods, making healthy choices, staying well hydrated and practicing mindfulness when eating..  Additional resources provided today: NA  Recommended Physical Activity Goals  Kendra Bennett has been advised to work up to 150 minutes of moderate intensity aerobic activity a week and strengthening exercises 2-3 times per week for cardiovascular health, weight loss maintenance and preservation of muscle mass.   She has agreed to Continue current level of  physical activity , Think about enjoyable ways to increase daily physical activity and overcoming barriers to exercise, and Increase physical activity in their day and reduce sedentary time (increase NEAT).    ASSOCIATED CONDITIONS ADDRESSED TODAY  Action/Plan  Type 2 diabetes mellitus without complication, without long-term current use of insulin (HCC) -     Increase Tirzepatide; Inject 12.5 mg into the skin once a week.  Dispense: 2 mL; Refill: 0.  Side effects discussed.   Abdominal pannus Will continue to monitor.  To follow up with PCP.   Generalized  obesity  BMI 33.0-33.9,adult         Return in about 4 weeks (around 03/25/2023).Marland Kitchen She was informed of the importance of frequent follow up visits to maximize her success with intensive lifestyle modifications for her multiple health conditions.   ATTESTASTION STATEMENTS:  Reviewed by clinician on day of visit: allergies, medications, problem list, medical history, surgical history, family history, social history, and previous encounter notes.     Theodis Sato. Myya Meenach FNP-C

## 2023-02-26 ENCOUNTER — Other Ambulatory Visit: Payer: Self-pay | Admitting: Nurse Practitioner

## 2023-03-03 ENCOUNTER — Ambulatory Visit: Payer: BC Managed Care – PPO

## 2023-03-05 ENCOUNTER — Ambulatory Visit: Payer: BC Managed Care – PPO | Admitting: Family

## 2023-03-10 ENCOUNTER — Ambulatory Visit: Payer: BC Managed Care – PPO

## 2023-03-10 DIAGNOSIS — Z1231 Encounter for screening mammogram for malignant neoplasm of breast: Secondary | ICD-10-CM | POA: Diagnosis not present

## 2023-03-12 ENCOUNTER — Encounter: Payer: Self-pay | Admitting: Family

## 2023-03-12 NOTE — Telephone Encounter (Signed)
Form has been printed and placed in PCP folder for review.

## 2023-03-15 ENCOUNTER — Other Ambulatory Visit: Payer: Self-pay | Admitting: Obstetrics and Gynecology

## 2023-03-15 DIAGNOSIS — R928 Other abnormal and inconclusive findings on diagnostic imaging of breast: Secondary | ICD-10-CM

## 2023-03-21 ENCOUNTER — Encounter: Payer: Self-pay | Admitting: Obstetrics and Gynecology

## 2023-04-01 ENCOUNTER — Other Ambulatory Visit: Payer: Self-pay | Admitting: Obstetrics and Gynecology

## 2023-04-01 ENCOUNTER — Ambulatory Visit: Payer: BC Managed Care – PPO | Admitting: Nurse Practitioner

## 2023-04-01 ENCOUNTER — Encounter: Payer: Self-pay | Admitting: Nurse Practitioner

## 2023-04-01 ENCOUNTER — Ambulatory Visit
Admission: RE | Admit: 2023-04-01 | Discharge: 2023-04-01 | Disposition: A | Discharge status: Home / Self Care | Payer: BC Managed Care – PPO | Source: Ambulatory Visit | Attending: Obstetrics and Gynecology | Admitting: Obstetrics and Gynecology

## 2023-04-01 VITALS — BP 106/71 | HR 68 | Temp 97.8°F | Ht 61.0 in | Wt 178.0 lb

## 2023-04-01 DIAGNOSIS — Z7985 Long-term (current) use of injectable non-insulin antidiabetic drugs: Secondary | ICD-10-CM | POA: Diagnosis not present

## 2023-04-01 DIAGNOSIS — Z79899 Other long term (current) drug therapy: Secondary | ICD-10-CM

## 2023-04-01 DIAGNOSIS — Z6833 Body mass index (BMI) 33.0-33.9, adult: Secondary | ICD-10-CM

## 2023-04-01 DIAGNOSIS — R928 Other abnormal and inconclusive findings on diagnostic imaging of breast: Secondary | ICD-10-CM

## 2023-04-01 DIAGNOSIS — E119 Type 2 diabetes mellitus without complications: Secondary | ICD-10-CM | POA: Diagnosis not present

## 2023-04-01 DIAGNOSIS — N6310 Unspecified lump in the right breast, unspecified quadrant: Secondary | ICD-10-CM

## 2023-04-01 DIAGNOSIS — E669 Obesity, unspecified: Secondary | ICD-10-CM

## 2023-04-01 DIAGNOSIS — N6315 Unspecified lump in the right breast, overlapping quadrants: Secondary | ICD-10-CM | POA: Diagnosis not present

## 2023-04-01 MED ORDER — TIRZEPATIDE 10 MG/0.5ML ~~LOC~~ SOAJ: 10.0000 mg | SUBCUTANEOUS | 0 refills | Status: DC

## 2023-04-01 NOTE — Progress Notes (Signed)
Office: [phone]  /  Fax: [phone]  WEIGHT SUMMARY AND BIOMETRICS  Weight Lost Since Last Visit: 1lb  Weight Gained Since Last Visit: 0lb   Vitals Temp: 97.8 F (36.6 C) BP: 106/71 Pulse Rate: 68 SpO2: 96 %   Anthropometric Measurements Height: 5\' 1"  (1.549 m) Weight: 178 lb (80.7 kg) BMI (Calculated): 33.65 Weight at Last Visit: 179lb Weight Lost Since Last Visit: 1lb Weight Gained Since Last Visit: 0lb Starting Weight: 187lb Total Weight Loss (lbs): 9 lb (4.082 kg)   Body Composition  Body Fat %: 38.4 % Fat Mass (lbs): 68.6 lbs Muscle Mass (lbs): 104.2 lbs Total Body Water (lbs): 73.2 lbs Visceral Fat Rating : 9   Other Clinical Data Fasting: No Labs: No Today's Visit #: 14 Starting Date: 01/12/22     HPI  Chief Complaint: OBESITY  Kendra Bennett is here to discuss her progress with her obesity treatment plan. She is on the the Category 2 Plan and states she is following her eating plan approximately 80 % of the time. She states she is exercising 20 minutes 7 days per week.   Interval History:  Since last office visit she has lost 1 pound.  She was doing well until Thanksgiving and then got off track. She is going to [facility] the week before Christmas. Notes that her clothes are fitting better and she is finding that she is wearing smaller sizes.  Since losing weight,  her feet don't hurt as much.  She finds she is more active and can "walk faster".    Pharmacotherapy for weight loss: She is not currently taking medications  for medical weight loss.   Previous pharmacotherapy for medical weight loss:  Wellbutrin-stopped due to side effects of blurry vision.   Bariatric surgery:  Patient has not had bariatric surgery.   Pharmacotherapy for DMT2:  She is currently taking Mounjaro 12.5mg .   Last A1c was 6.2 on 12/18/22 She is not checking BS at home.   Episodes of hypoglycemia: has had 2 episodes feeling like her sugar was dropping since increasing  Mounjaro to 12.5mg  On ARB, ASA 81mg   Last eye exam:  10/24 She has tried Trulicity in the past.    Lab Results  Component Value Date   HGBA1C 6.2 12/18/2022   HGBA1C 6.9 (H) 06/03/2022   HGBA1C 6.6 (H) 01/12/2022   Lab Results  Component Value Date   LDLCALC 111 (H) 12/18/2022   CREATININE 0.76 12/18/2022       PHYSICAL EXAM:  Blood pressure 106/71, pulse 68, temperature 97.8 F (36.6 C), height 5\' 1"  (1.549 m), weight 178 lb (80.7 kg), last menstrual period 03/10/2023, SpO2 96%. Body mass index is 33.63 kg/m.  General: She is overweight, cooperative, alert, well developed, and in no acute distress. PSYCH: Has normal mood, affect and thought process.   Extremities: No edema.  Neurologic: No gross sensory or motor deficits. No tremors or fasciculations noted.    DIAGNOSTIC DATA REVIEWED:  BMET    Component Value Date/Time   NA 138 12/18/2022 0932   NA 142 06/03/2022 1600   K 4.0 12/18/2022 0932   CL 104 12/18/2022 0932   CO2 25 12/18/2022 0932   GLUCOSE 94 12/18/2022 0932   BUN 14 12/18/2022 0932   BUN 7 06/03/2022 1600   CREATININE 0.76 12/18/2022 0932   CREATININE 0.61 09/05/2021 1453   CALCIUM 9.2 12/18/2022 0932   GFRNONAA >60 08/04/2015 1325   GFRAA >60 08/04/2015 1325   Lab Results  Component Value  Date   HGBA1C 6.2 12/18/2022   HGBA1C 7.1 (H) 09/05/2021   Lab Results  Component Value Date   INSULIN 23.1 01/12/2022   Lab Results  Component Value Date   TSH 1.910 12/31/2022   CBC    Component Value Date/Time   WBC 8.9 12/31/2022 0901   WBC 11.7 (H) 12/18/2022 0932   RBC 5.23 12/31/2022 0901   RBC 5.35 (H) 12/18/2022 0932   HGB 14.6 12/31/2022 0901   HCT 44.7 12/31/2022 0901   PLT 294 12/31/2022 0901   MCV 86 12/31/2022 0901   MCH 27.9 12/31/2022 0901   MCH 27.7 09/05/2021 1453   MCHC 32.7 12/31/2022 0901   MCHC 32.6 12/18/2022 0932   RDW 13.3 12/31/2022 0901   Iron Studies    Component Value Date/Time   IRON 53 12/31/2022 0901    FERRITIN 43 12/31/2022 0901   Lipid Panel     Component Value Date/Time   CHOL 170 12/18/2022 0932   CHOL 170 01/12/2022 0856   TRIG 119.0 12/18/2022 0932   HDL 35.10 (L) 12/18/2022 0932   HDL 48 01/12/2022 0856   CHOLHDL 5 12/18/2022 0932   VLDL 23.8 12/18/2022 0932   LDLCALC 111 (H) 12/18/2022 0932   LDLCALC 99 01/12/2022 0856   Hepatic Function Panel     Component Value Date/Time   PROT 7.2 12/18/2022 0932   PROT 6.9 06/03/2022 1600   ALBUMIN 4.0 12/18/2022 0932   ALBUMIN 4.2 06/03/2022 1600   AST 12 12/18/2022 0932   ALT 15 12/18/2022 0932   ALKPHOS 70 12/18/2022 0932   BILITOT 0.6 12/18/2022 0932   BILITOT 0.4 06/03/2022 1600      Component Value Date/Time   TSH 1.910 12/31/2022 0901   Nutritional Lab Results  Component Value Date   VD25OH 62.39 12/18/2022   VD25OH 40.8 06/03/2022   VD25OH 21.3 (L) 01/12/2022     ASSESSMENT AND PLAN  TREATMENT PLAN FOR OBESITY:  Recommended Dietary Goals  Kendra Bennett is currently in the action stage of change. As such, her goal is to continue weight management plan. She has agreed to the Category 2 Plan.  Behavioral Intervention  We discussed the following Behavioral Modification Strategies today: increasing lean protein intake to established goals, increasing water intake , avoiding temptations and identifying enticing environmental cues, continue to practice mindfulness when eating, and continue to work on maintaining a reduced calorie state, getting the recommended amount of protein, incorporating whole foods, making healthy choices, staying well hydrated and practicing mindfulness when eating..  Additional resources provided today: NA  Recommended Physical Activity Goals  Kendra Bennett has been advised to work up to 150 minutes of moderate intensity aerobic activity a week and strengthening exercises 2-3 times per week for cardiovascular health, weight loss maintenance and preservation of muscle mass.   She has agreed to  Think about enjoyable ways to increase daily physical activity and overcoming barriers to exercise, Increase physical activity in their day and reduce sedentary time (increase NEAT)., Increase the intensity, frequency or duration of strengthening exercises , and Increase the intensity, frequency or duration of aerobic exercises     ASSOCIATED CONDITIONS ADDRESSED TODAY  Action/Plan  Type 2 diabetes mellitus without complication, without long-term current use of insulin (HCC) -     Decrease Tirzepatide; Inject 10 mg into the skin once a week due to side effects.  Dispense: 2 mL; Refill: 0. Side effects discussed.  Will let me know if she has any further episodes of feeling like she  is hypoglycemic.    Generalized obesity  BMI 33.0-33.9,adult     Labs obtained today-A1c and CMP    Return in about 4 weeks (around 04/29/2023).Marland Kitchen She was informed of the importance of frequent follow up visits to maximize her success with intensive lifestyle modifications for her multiple health conditions.   ATTESTASTION STATEMENTS:  Reviewed by clinician on day of visit: allergies, medications, problem list, medical history, surgical history, family history, social history, and previous encounter notes.     Theodis Sato. Wah Sabic FNP-C

## 2023-04-02 LAB — COMPREHENSIVE METABOLIC PANEL
ALT: 14 [IU]/L (ref 0–32)
AST: 17 [IU]/L (ref 0–40)
Albumin: 4.2 g/dL (ref 3.9–4.9)
Alkaline Phosphatase: 77 [IU]/L (ref 44–121)
BUN/Creatinine Ratio: 19 (ref 9–23)
BUN: 16 mg/dL (ref 6–24)
Bilirubin Total: 0.6 mg/dL (ref 0.0–1.2)
CO2: 21 mmol/L (ref 20–29)
Calcium: 9.6 mg/dL (ref 8.7–10.2)
Chloride: 102 mmol/L (ref 96–106)
Creatinine, Ser: 0.86 mg/dL (ref 0.57–1.00)
Globulin, Total: 3 g/dL (ref 1.5–4.5)
Glucose: 102 mg/dL — ABNORMAL HIGH (ref 70–99)
Potassium: 4.3 mmol/L (ref 3.5–5.2)
Sodium: 139 mmol/L (ref 134–144)
Total Protein: 7.2 g/dL (ref 6.0–8.5)
eGFR: 85 mL/min/{1.73_m2} (ref >59)

## 2023-04-02 LAB — HEMOGLOBIN A1C
Est. average glucose Bld gHb Est-mCnc: 123 mg/dL
Hgb A1c MFr Bld: 5.9 % — ABNORMAL HIGH (ref 4.8–5.6)

## 2023-04-06 ENCOUNTER — Telehealth: Payer: Self-pay | Admitting: Family

## 2023-04-06 NOTE — Telephone Encounter (Signed)
Form has been placed in PCP folder for review.

## 2023-04-06 NOTE — Telephone Encounter (Signed)
Pt dropped off document to be filled out by provider([facility] form - 1 page) pt would like to have document emailed to her when ready to : [email or web address]. Document put at front office tray under provider name.

## 2023-04-08 ENCOUNTER — Ambulatory Visit
Admission: RE | Admit: 2023-04-08 | Discharge: 2023-04-08 | Disposition: A | Discharge status: Home / Self Care | Payer: BC Managed Care – PPO | Source: Ambulatory Visit | Attending: Obstetrics and Gynecology | Admitting: Obstetrics and Gynecology

## 2023-04-08 DIAGNOSIS — R928 Other abnormal and inconclusive findings on diagnostic imaging of breast: Secondary | ICD-10-CM

## 2023-04-08 DIAGNOSIS — N6310 Unspecified lump in the right breast, unspecified quadrant: Secondary | ICD-10-CM

## 2023-04-08 DIAGNOSIS — N6315 Unspecified lump in the right breast, overlapping quadrants: Secondary | ICD-10-CM | POA: Diagnosis not present

## 2023-04-08 DIAGNOSIS — N6031 Fibrosclerosis of right breast: Secondary | ICD-10-CM | POA: Diagnosis not present

## 2023-04-08 HISTORY — PX: BREAST BIOPSY: SHX20

## 2023-04-08 NOTE — Telephone Encounter (Signed)
Form has been emailed to pt, sent pt a Mychart message to inform her.

## 2023-04-09 LAB — SURGICAL PATHOLOGY

## 2023-04-26 ENCOUNTER — Ambulatory Visit: Payer: BC Managed Care – PPO | Admitting: Bariatrics

## 2023-05-05 ENCOUNTER — Ambulatory Visit: Payer: BC Managed Care – PPO | Admitting: Family Medicine

## 2023-05-05 ENCOUNTER — Ambulatory Visit: Payer: BC Managed Care – PPO | Admitting: Bariatrics

## 2023-05-05 ENCOUNTER — Encounter: Payer: Self-pay | Admitting: Family Medicine

## 2023-05-05 VITALS — BP 102/70 | HR 64 | Temp 98.1°F | Wt 185.6 lb

## 2023-05-05 DIAGNOSIS — K130 Diseases of lips: Secondary | ICD-10-CM

## 2023-05-05 DIAGNOSIS — B349 Viral infection, unspecified: Secondary | ICD-10-CM | POA: Diagnosis not present

## 2023-05-05 LAB — POCT INFLUENZA A/B
Influenza A, POC: NEGATIVE
Influenza B, POC: NEGATIVE

## 2023-05-05 NOTE — Progress Notes (Addendum)
 Kendra Bennett , 1981/02/24, 43 y.o., female MRN: [id number] Patient Care Team    Relationship Specialty Notifications Start End  Jason Leita Repine, FNP PCP - General Internal Medicine  09/05/21     Chief Complaint  Patient presents with   Covid Exposure    Exposed on Monday; started having chills yesterday, denies other sx; cracked lips for 3 weeks     Subjective: Kendra Bennett is a 43 y.o. Pt presents for an OV with complaints of chills and feeling off of 3rd day duration.  Associated symptoms include cracked, dry lips for 3 weeks.  recent covid exposure. Pt denies PND, congestion, cough, sinus pressure, shortness of breath ear pain or headache.  For her lips she tried chap stick and was able to get her lipd healed, until the weather became cold again.   Pt has tried chapstick, tylenol and advil to ease their symptoms.      12/18/2022    8:48 AM 11/20/2022    1:22 PM 01/12/2022    7:27 AM 09/05/2021    2:15 PM  Depression screen PHQ 2/9  Decreased Interest 0 0 3 0  Down, Depressed, Hopeless 0 0 0 0  PHQ - 2 Score 0 0 3 0  Altered sleeping   0   Tired, decreased energy   3   Change in appetite   0   Feeling bad or failure about yourself    0   Trouble concentrating   0   Moving slowly or fidgety/restless   0   Suicidal thoughts   0   PHQ-9 Score   6   Difficult doing work/chores   Not difficult at all     Allergies  Allergen Reactions   Ciprofloxacin Other (See Comments)   Wellbutrin  [Bupropion ] Other (See Comments)    Blurry vision   Penicillins Rash   Prednisolone Rash   Social History   Social History Narrative   Not on file   Past Medical History:  Diagnosis Date   Anxiety    Diabetes (HCC)    Eczema    High blood pressure    PCOS (polycystic ovarian syndrome)    Vitamin D  deficiency    Past Surgical History:  Procedure Laterality Date   BREAST BIOPSY Right 04/08/2023   US  RT BREAST BX W LOC DEV 1ST LESION IMG BX SPEC US  GUIDE  04/08/2023 [facility]   BREAST BIOPSY Right 04/08/2023   US  RT BREAST BX W LOC DEV EA ADD LESION IMG BX SPEC US  GUIDE 04/08/2023 [facility]   WISDOM TOOTH EXTRACTION     Family History  Problem Relation Age of Onset   High blood pressure Mother    Heart disease Mother    Anxiety disorder Mother    Diabetes Father    High blood pressure Father    High Cholesterol Father    Kidney disease Father    Allergies as of 05/05/2023       Reactions   Ciprofloxacin Other (See Comments)   Wellbutrin  [bupropion ] Other (See Comments)   Blurry vision   Penicillins Rash   Prednisolone Rash        Medication List        Accurate as of May 05, 2023 10:03 AM. If you have any questions, ask your nurse or doctor.          cetirizine 10 MG tablet Commonly known as: ZYRTEC Take 10 mg by mouth daily.   clobetasol ointment  0.05 % Commonly known as: TEMOVATE Use 2 x/day to thick areas   desonide 0.05 % cream Commonly known as: DESOWEN Apply to the face 1-2 times a day as needed for atopic dermatitis   Dupixent 300 MG/2ML prefilled syringe Generic drug: dupilumab Inject into the skin.   fluticasone  50 MCG/ACT nasal spray Commonly known as: FLONASE  Place 2 sprays into both nostrils daily.   MAGNESIUM PO Take by mouth.   norethindrone  0.35 MG tablet Commonly known as: MICRONOR  Take 1 tablet (0.35 mg total) by mouth daily.   olmesartan  20 MG tablet Commonly known as: BENICAR  Take 1 tablet (20 mg total) by mouth daily.   tirzepatide  10 MG/0.5ML Pen Commonly known as: MOUNJARO  Inject 10 mg into the skin once a week.   VITAMIN D  PO Take by mouth.        All past medical history, surgical history, allergies, family history, immunizations andmedications were updated in the EMR today and reviewed under the history and medication portions of their EMR.     ROS Negative, with the exception of above mentioned in HPI   Objective:  BP 102/70   Pulse 64    Temp 98.1 F (36.7 C)   Wt 185 lb 9.6 oz (84.2 kg)   SpO2 98%   BMI 35.07 kg/m  Body mass index is 35.07 kg/m. Physical Exam Vitals and nursing note reviewed.  Constitutional:      General: She is not in acute distress.    Appearance: Normal appearance. She is normal weight. She is not ill-appearing or toxic-appearing.  HENT:     Head: Normocephalic and atraumatic.     Comments: Dry cracked lips present    Right Ear: Tympanic membrane, ear canal and external ear normal. There is no impacted cerumen.     Left Ear: Tympanic membrane, ear canal and external ear normal. There is no impacted cerumen.     Nose: Rhinorrhea present. No congestion.     Mouth/Throat:     Mouth: Mucous membranes are moist.     Pharynx: No oropharyngeal exudate or posterior oropharyngeal erythema.  Eyes:     General: No scleral icterus.       Right eye: No discharge.        Left eye: No discharge.     Extraocular Movements: Extraocular movements intact.     Conjunctiva/sclera: Conjunctivae normal.     Pupils: Pupils are equal, round, and reactive to light.  Cardiovascular:     Rate and Rhythm: Normal rate and regular rhythm.  Pulmonary:     Effort: Pulmonary effort is normal. No respiratory distress.     Breath sounds: Normal breath sounds. No wheezing, rhonchi or rales.  Musculoskeletal:     Right lower leg: No edema.     Left lower leg: No edema.  Skin:    Findings: No rash.  Neurological:     Mental Status: She is alert and oriented to person, place, and time. Mental status is at baseline.     Motor: No weakness.     Coordination: Coordination normal.     Gait: Gait normal.  Psychiatric:        Mood and Affect: Mood normal.        Behavior: Behavior normal.        Thought Content: Thought content normal.        Judgment: Judgment normal.      No results found. No results found. Results for orders placed or performed in visit on 05/05/23 (  from the past 24 hours)  POCT Influenza A/B      Status: None   Collection Time: 05/05/23 10:02 AM  Result Value Ref Range   Influenza A, POC Negative Negative   Influenza B, POC Negative Negative    Assessment/Plan: Kendra Bennett is a 43 y.o. female present for OV for  Viral illness (Primary) Rest, hydrate.  Covid (sample used) test negative today Influenza A/B test is negative today OTC advil/tylenol,mucinex (DM if cough), nettie pot or nasal saline as needed.  F/u 1-2 weeks with pcp if symptoms worsen.   Cheilitis Recommended [place] 's bag balm lip care.   Reviewed expectations re: course of current medical issues. Discussed self-management of symptoms. Outlined signs and symptoms indicating need for more acute intervention. Patient verbalized understanding and all questions were answered. Patient received an After-Visit Summary.    Orders Placed This Encounter  Procedures   POCT Influenza A/B   No orders of the defined types were placed in this encounter.  Referral Orders  No referral(s) requested today     Note is dictated utilizing voice recognition software. Although note has been proof read prior to signing, occasional typographical errors still can be missed. If any questions arise, please do not hesitate to call for verification.   electronically signed by:  Charlies Bellini, DO  [facility] - [place]

## 2023-05-05 NOTE — Patient Instructions (Signed)

## 2023-05-13 ENCOUNTER — Encounter: Payer: Self-pay | Admitting: Nurse Practitioner

## 2023-05-13 ENCOUNTER — Ambulatory Visit: Payer: BC Managed Care – PPO | Admitting: Nurse Practitioner

## 2023-05-13 VITALS — BP 113/76 | HR 68 | Temp 98.0°F | Ht 61.0 in | Wt 178.0 lb

## 2023-05-13 DIAGNOSIS — E119 Type 2 diabetes mellitus without complications: Secondary | ICD-10-CM | POA: Diagnosis not present

## 2023-05-13 DIAGNOSIS — Z6833 Body mass index (BMI) 33.0-33.9, adult: Secondary | ICD-10-CM

## 2023-05-13 DIAGNOSIS — Z7985 Long-term (current) use of injectable non-insulin antidiabetic drugs: Secondary | ICD-10-CM

## 2023-05-13 DIAGNOSIS — E669 Obesity, unspecified: Secondary | ICD-10-CM

## 2023-05-13 MED ORDER — TIRZEPATIDE 7.5 MG/0.5ML ~~LOC~~ SOAJ: 7.5000 mg | SUBCUTANEOUS | 0 refills | Status: DC

## 2023-05-13 NOTE — Progress Notes (Signed)
Office: [phone]  /  Fax: [phone]  WEIGHT SUMMARY AND BIOMETRICS  Weight Lost Since Last Visit: 0lb  Weight Gained Since Last Visit: 0lb   Vitals Temp: 98 F (36.7 C) BP: 113/76 Pulse Rate: 68 SpO2: 96 %   Anthropometric Measurements Height: 5\' 1"  (1.549 m) Weight: 178 lb (80.7 kg) BMI (Calculated): 33.65 Weight at Last Visit: 178lb Weight Lost Since Last Visit: 0lb Weight Gained Since Last Visit: 0lb Starting Weight: 187lb Total Weight Loss (lbs): 9 lb (4.082 kg)   Body Composition  Body Fat %: 39.1 % Fat Mass (lbs): 70 lbs Muscle Mass (lbs): 103.2 lbs Total Body Water (lbs): 73.8 lbs Visceral Fat Rating : 9   Other Clinical Data Fasting: No Labs: No Today's Visit #: 15 Starting Date: 01/12/22     HPI  Chief Complaint: OBESITY  Iver is here to discuss her progress with her obesity treatment plan. She is on the the Category 2 Plan and states she is following her eating plan approximately 50 % of the time. She states she is exercising 20 minutes 1-3 days per week.   Interval History:  Since last office visit she has maintained her weight.  She missed her last 2 appts due to not feeling well.  She is here today to get back on track.    She has multiple birthday party celebrations coming up  Pharmacotherapy for weight loss: She is not currently taking medications  for medical weight loss.    Previous pharmacotherapy for medical weight loss:  Wellbutrin-stopped due to side effects of blurry vision.   Bariatric surgery:  Patient has not had bariatric surgery.   Pharmacotherapy for DMT2:  Since her last visit she has taken Mounjaro 10mg  x 4 doses and then took a 5mg , last 2 weeks ago. Since being off Mounjaro she is struggling with polyphagia and cravings.  Denies side effects.   Last A1c was 5.9 She is not checking BS at home.   Episodes of hypoglycemia: no On ARB, ASA 81mg   Last eye exam:  10/24 She has tried Trulicity in the past.     Lab Results  Component Value Date   HGBA1C 5.9 (H) 04/01/2023   HGBA1C 6.2 12/18/2022   HGBA1C 6.9 (H) 06/03/2022   Lab Results  Component Value Date   LDLCALC 111 (H) 12/18/2022   CREATININE 0.86 04/01/2023      PHYSICAL EXAM:  Blood pressure 113/76, pulse 68, temperature 98 F (36.7 C), height 5\' 1"  (1.549 m), weight 178 lb (80.7 kg), last menstrual period 04/05/2023, SpO2 96%. Body mass index is 33.63 kg/m.  General: She is overweight, cooperative, alert, well developed, and in no acute distress. PSYCH: Has normal mood, affect and thought process.   Extremities: No edema.  Neurologic: No gross sensory or motor deficits. No tremors or fasciculations noted.    DIAGNOSTIC DATA REVIEWED:  BMET    Component Value Date/Time   NA 139 04/01/2023 0833   K 4.3 04/01/2023 0833   CL 102 04/01/2023 0833   CO2 21 04/01/2023 0833   GLUCOSE 102 (H) 04/01/2023 0833   GLUCOSE 94 12/18/2022 0932   BUN 16 04/01/2023 0833   CREATININE 0.86 04/01/2023 0833   CREATININE 0.61 09/05/2021 1453   CALCIUM 9.6 04/01/2023 0833   GFRNONAA >60 08/04/2015 1325   GFRAA >60 08/04/2015 1325   Lab Results  Component Value Date   HGBA1C 5.9 (H) 04/01/2023   HGBA1C 7.1 (H) 09/05/2021   Lab Results  Component Value  Date   INSULIN 23.1 01/12/2022   Lab Results  Component Value Date   TSH 1.910 12/31/2022   CBC    Component Value Date/Time   WBC 8.9 12/31/2022 0901   WBC 11.7 (H) 12/18/2022 0932   RBC 5.23 12/31/2022 0901   RBC 5.35 (H) 12/18/2022 0932   HGB 14.6 12/31/2022 0901   HCT 44.7 12/31/2022 0901   PLT 294 12/31/2022 0901   MCV 86 12/31/2022 0901   MCH 27.9 12/31/2022 0901   MCH 27.7 09/05/2021 1453   MCHC 32.7 12/31/2022 0901   MCHC 32.6 12/18/2022 0932   RDW 13.3 12/31/2022 0901   Iron Studies    Component Value Date/Time   IRON 53 12/31/2022 0901   FERRITIN 43 12/31/2022 0901   Lipid Panel     Component Value Date/Time   CHOL 170 12/18/2022 0932   CHOL 170  01/12/2022 0856   TRIG 119.0 12/18/2022 0932   HDL 35.10 (L) 12/18/2022 0932   HDL 48 01/12/2022 0856   CHOLHDL 5 12/18/2022 0932   VLDL 23.8 12/18/2022 0932   LDLCALC 111 (H) 12/18/2022 0932   LDLCALC 99 01/12/2022 0856   Hepatic Function Panel     Component Value Date/Time   PROT 7.2 04/01/2023 0833   ALBUMIN 4.2 04/01/2023 0833   AST 17 04/01/2023 0833   ALT 14 04/01/2023 0833   ALKPHOS 77 04/01/2023 0833   BILITOT 0.6 04/01/2023 0833      Component Value Date/Time   TSH 1.910 12/31/2022 0901   Nutritional Lab Results  Component Value Date   VD25OH 62.39 12/18/2022   VD25OH 40.8 06/03/2022   VD25OH 21.3 (L) 01/12/2022     ASSESSMENT AND PLAN  TREATMENT PLAN FOR OBESITY:  Recommended Dietary Goals  Kendra Bennett is currently in the action stage of change. As such, her goal is to continue weight management plan. She has agreed to the Category 2 Plan.  Encouraged to track and will review at next visit.   Behavioral Intervention  We discussed the following Behavioral Modification Strategies today: increasing lean protein intake to established goals, decreasing simple carbohydrates , increasing vegetables, work on meal planning and preparation, reading food labels , keeping healthy foods at home, planning for success, better snacking choices, and continue to work on maintaining a reduced calorie state, getting the recommended amount of protein, incorporating whole foods, making healthy choices, staying well hydrated and practicing mindfulness when eating..  Additional resources provided today: NA  Recommended Physical Activity Goals  Tessy has been advised to work up to 150 minutes of moderate intensity aerobic activity a week and strengthening exercises 2-3 times per week for cardiovascular health, weight loss maintenance and preservation of muscle mass.   She has agreed to Think about enjoyable ways to increase daily physical activity and overcoming barriers to  exercise, Increase physical activity in their day and reduce sedentary time (increase NEAT)., Increase the intensity, frequency or duration of strengthening exercises , Increase the intensity, frequency or duration of aerobic exercises  , and Work on scheduling and tracking physical activity.     ASSOCIATED CONDITIONS ADDRESSED TODAY  Action/Plan  Type 2 diabetes mellitus without complication, without long-term current use of insulin (HCC) -     Refill Tirzepatide; Inject 7.5 mg into the skin once a week.  Dispense: 2 mL; Refill: 0. Side effects discussed  Good blood sugar control is important to decrease the likelihood of diabetic complications such as nephropathy, neuropathy, limb loss, blindness, coronary artery disease, and death.  Intensive lifestyle modification including diet, exercise and weight loss are the first line of treatment for diabetes.    Generalized obesity  BMI 33.0-33.9,adult       Labs reviewed in chart with patient from 04/01/23.    Return in about 4 weeks (around 06/10/2023).Marland Kitchen She was informed of the importance of frequent follow up visits to maximize her success with intensive lifestyle modifications for her multiple health conditions.   ATTESTASTION STATEMENTS:  Reviewed by clinician on day of visit: allergies, medications, problem list, medical history, surgical history, family history, social history, and previous encounter notes.    Theodis Sato. Jayleena Stille FNP-C

## 2023-06-10 ENCOUNTER — Ambulatory Visit: Payer: BC Managed Care – PPO | Admitting: Nurse Practitioner

## 2023-06-10 ENCOUNTER — Encounter: Payer: Self-pay | Admitting: Physician Assistant

## 2023-06-10 ENCOUNTER — Encounter: Payer: Self-pay | Admitting: Nurse Practitioner

## 2023-06-10 ENCOUNTER — Ambulatory Visit: Payer: Self-pay | Admitting: Family

## 2023-06-10 ENCOUNTER — Ambulatory Visit: Payer: BC Managed Care – PPO | Admitting: Physician Assistant

## 2023-06-10 VITALS — BP 122/79 | HR 85 | Ht 61.0 in | Wt 185.8 lb

## 2023-06-10 VITALS — BP 112/73 | HR 77 | Temp 98.2°F | Ht 61.0 in | Wt 180.0 lb

## 2023-06-10 DIAGNOSIS — E119 Type 2 diabetes mellitus without complications: Secondary | ICD-10-CM | POA: Diagnosis not present

## 2023-06-10 DIAGNOSIS — I10 Essential (primary) hypertension: Secondary | ICD-10-CM | POA: Diagnosis not present

## 2023-06-10 DIAGNOSIS — R079 Chest pain, unspecified: Secondary | ICD-10-CM

## 2023-06-10 DIAGNOSIS — R42 Dizziness and giddiness: Secondary | ICD-10-CM | POA: Diagnosis not present

## 2023-06-10 DIAGNOSIS — Z7985 Long-term (current) use of injectable non-insulin antidiabetic drugs: Secondary | ICD-10-CM

## 2023-06-10 DIAGNOSIS — E66811 Obesity, class 1: Secondary | ICD-10-CM

## 2023-06-10 DIAGNOSIS — Z6834 Body mass index (BMI) 34.0-34.9, adult: Secondary | ICD-10-CM

## 2023-06-10 MED ORDER — TIRZEPATIDE 7.5 MG/0.5ML ~~LOC~~ SOAJ: 7.5000 mg | SUBCUTANEOUS | 0 refills | Status: DC

## 2023-06-10 NOTE — Progress Notes (Signed)
Office: [phone]  /  Fax: [phone]  WEIGHT SUMMARY AND BIOMETRICS  Weight Lost Since Last Visit: 0lb  Weight Gained Since Last Visit: 2lb   Vitals Temp: 98.2 F (36.8 C) BP: 112/73 Pulse Rate: 77 SpO2: 98 %   Anthropometric Measurements Height: 5\' 1"  (1.549 m) Weight: 180 lb (81.6 kg) BMI (Calculated): 34.03 Weight at Last Visit: 178lb Weight Lost Since Last Visit: 0lb Weight Gained Since Last Visit: 2lb Starting Weight: 187lb Total Weight Loss (lbs): 7 lb (3.175 kg)   Body Composition  Body Fat %: 39.1 % Fat Mass (lbs): 70.4 lbs Muscle Mass (lbs): 104 lbs Total Body Water (lbs): 72.6 lbs Visceral Fat Rating : 9   Other Clinical Data Fasting: Yes Labs: No Today's Visit #: 16 Starting Date: 01/12/22     HPI  Chief Complaint: OBESITY  Kendra Bennett is here to discuss her progress with her obesity treatment plan. She is on the the Category 2 Plan and states she is following her eating plan approximately 80 % of the time. She states she is exercising 20 minutes 3 days per week.   Interval History:  Since last office visit she has gained 2 pounds.  She has been under stress over the past 4 weeks.  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  Finds that she gets stressed before the show and finds that she will eat more sweets. She is not skipping meals. She is trying to increase resistance training.    BF:  2 eggs with rice (12 grams of protein) Snack:  protein bar (10 grams of protein) Lunch:  protein shake (30 grams of protein) Snack:  protein bar (10 grams of protein) Snack:  protein bar (10 grams of protein) Dinner:  protein, vegetable, carb (20 grams of protein)  Pharmacotherapy for weight loss: She is not currently taking medications  for medical weight loss.    Previous pharmacotherapy for medical weight loss:  Wellbutrin-stopped due to side effects of blurry vision.   Bariatric surgery:  Patient has not had  bariatric surgery.   Pharmacotherapy for DMT2:  She is currently taking Mounjaro 7.5mg .  Denies side effects.   Last A1c was 5.9 She is not checking BS at home.   Episodes of hypoglycemia: no On ARB & ASA 81mg   Last eye exam:  10/24 She has tried Trulicity in the past.    Lab Results  Component Value Date   HGBA1C 5.9 (H) 04/01/2023   HGBA1C 6.2 12/18/2022   HGBA1C 6.9 (H) 06/03/2022   Lab Results  Component Value Date   LDLCALC 111 (H) 12/18/2022   CREATININE 0.86 04/01/2023    Hypertension Hypertension stable.  Medication(s): Benicar 20mg . Denies side effects.   Denies palpitations and SOB. Notes some dizziness when she is squatting and some chest pain off an on for the past month.  Feels more when she stressed at work.  She is working and finishing her notes on the weekend.  At times, she doesn't sleep well due to finishing her notes on the weekend   BP Readings from Last 3 Encounters:  06/10/23 112/73  05/13/23 113/76  05/05/23 102/70   Lab Results  Component Value Date   CREATININE 0.86 04/01/2023   CREATININE 0.76 12/18/2022   CREATININE 0.56 (L) 06/03/2022     PHYSICAL EXAM:  Blood pressure 112/73, pulse 77, temperature 98.2 F (36.8 C), height 5\' 1"  (1.549 m), weight 180 lb (81.6 kg), last menstrual period  05/07/2023, SpO2 98%. Body mass index is 34.01 kg/m.  General: She is overweight, cooperative, alert, well developed, and in no acute distress. PSYCH: Has normal mood, affect and thought process.   Extremities: No edema.  Neurologic: No gross sensory or motor deficits. No tremors or fasciculations noted.    DIAGNOSTIC DATA REVIEWED:  BMET    Component Value Date/Time   NA 139 04/01/2023 0833   K 4.3 04/01/2023 0833   CL 102 04/01/2023 0833   CO2 21 04/01/2023 0833   GLUCOSE 102 (H) 04/01/2023 0833   GLUCOSE 94 12/18/2022 0932   BUN 16 04/01/2023 0833   CREATININE 0.86 04/01/2023 0833   CREATININE 0.61 09/05/2021 1453   CALCIUM 9.6 04/01/2023  0833   GFRNONAA >60 08/04/2015 1325   GFRAA >60 08/04/2015 1325   Lab Results  Component Value Date   HGBA1C 5.9 (H) 04/01/2023   HGBA1C 7.1 (H) 09/05/2021   Lab Results  Component Value Date   INSULIN 23.1 01/12/2022   Lab Results  Component Value Date   TSH 1.910 12/31/2022   CBC    Component Value Date/Time   WBC 8.9 12/31/2022 0901   WBC 11.7 (H) 12/18/2022 0932   RBC 5.23 12/31/2022 0901   RBC 5.35 (H) 12/18/2022 0932   HGB 14.6 12/31/2022 0901   HCT 44.7 12/31/2022 0901   PLT 294 12/31/2022 0901   MCV 86 12/31/2022 0901   MCH 27.9 12/31/2022 0901   MCH 27.7 09/05/2021 1453   MCHC 32.7 12/31/2022 0901   MCHC 32.6 12/18/2022 0932   RDW 13.3 12/31/2022 0901   Iron Studies    Component Value Date/Time   IRON 53 12/31/2022 0901   FERRITIN 43 12/31/2022 0901   Lipid Panel     Component Value Date/Time   CHOL 170 12/18/2022 0932   CHOL 170 01/12/2022 0856   TRIG 119.0 12/18/2022 0932   HDL 35.10 (L) 12/18/2022 0932   HDL 48 01/12/2022 0856   CHOLHDL 5 12/18/2022 0932   VLDL 23.8 12/18/2022 0932   LDLCALC 111 (H) 12/18/2022 0932   LDLCALC 99 01/12/2022 0856   Hepatic Function Panel     Component Value Date/Time   PROT 7.2 04/01/2023 0833   ALBUMIN 4.2 04/01/2023 0833   AST 17 04/01/2023 0833   ALT 14 04/01/2023 0833   ALKPHOS 77 04/01/2023 0833   BILITOT 0.6 04/01/2023 0833      Component Value Date/Time   TSH 1.910 12/31/2022 0901   Nutritional Lab Results  Component Value Date   VD25OH 62.39 12/18/2022   VD25OH 40.8 06/03/2022   VD25OH 21.3 (L) 01/12/2022     ASSESSMENT AND PLAN  TREATMENT PLAN FOR OBESITY:  Recommended Dietary Goals  Kendra Bennett is currently in the action stage of change. As such, her goal is to continue weight management plan. She has agreed to keeping a food journal and adhering to recommended goals of 1300 calories and 100+ protein. Track using mynetdiary, myfitnesspal or lose it app.  Patient is eating a lot of  processed foods and is not eating enough vegetables or fruits.  Advised her to stop protein shakes and protein bars.  We discussed extensively today that food is fuel for her body like gas is fuel for a car.  If you put in the wrong fuel then you are not going to feel well and she is not eating correctly.  I will have her track and will review at her next visit.     Behavioral Intervention  We discussed the following Behavioral Modification Strategies today: increasing lean protein intake to established goals, decreasing simple carbohydrates , increasing vegetables, increasing fiber rich foods, increasing water intake , work on meal planning and preparation, work on tracking and journaling calories using tracking application, planning for success, and continue to work on maintaining a reduced calorie state, getting the recommended amount of protein, incorporating whole foods, making healthy choices, staying well hydrated and practicing mindfulness when eating..  Additional resources provided today: NA  Recommended Physical Activity Goals  Kendra Bennett has been advised to work up to 150 minutes of moderate intensity aerobic activity a week and strengthening exercises 2-3 times per week for cardiovascular health, weight loss maintenance and preservation of muscle mass.   She has agreed to Think about enjoyable ways to increase daily physical activity and overcoming barriers to exercise, Increase physical activity in their day and reduce sedentary time (increase NEAT)., Increase the intensity, frequency or duration of strengthening exercises , and Increase the intensity, frequency or duration of aerobic exercises      ASSOCIATED CONDITIONS ADDRESSED TODAY  Action/Plan  Essential hypertension Patient had multiple complaints today of dizziness, chest pain off and on, anxiety, more stress at work, not sleeping well, etc. I offered to contact her PCP to schedule an appointment and the patient states she  will call today to schedule an appointment.  To go to the ER with chest pains, shortness of breath, palpitations.  Continue medications as directed.  Type 2 diabetes mellitus without complication, without long-term current use of insulin (HCC) -     Tirzepatide; Inject 7.5 mg into the skin once a week.  Dispense: 2 mL; Refill: 0.  Side effects discussed.  I did offer to to decrease her Mounjaro to 5 mg today but she would like continue at 7.5.  At her next visit, I will discuss decreasing back to 5 mg if she continues to have complaints of fatigue/not feeling well.   Class 1 obesity due to excess calories without serious comorbidity with body mass index (BMI) of 34.0 to 34.9 in adult     On the way out the patient tells me that her co-pay here has increased and she is not sure if she will be able to continue coming to the office.  I had her discuss with my front office manager and we have given her information to billing.  The front office manager does not feel that her co-pay should be that high and we will also send an email to billing concerning this patient.  She will contact the office and let us know if she is going to reschedule an appointment or not and also to let me know when she has an appointment scheduled with her PCP    Return in about 4 weeks (around 07/08/2023).Marland Kitchen She was informed of the importance of frequent follow up visits to maximize her success with intensive lifestyle modifications for her multiple health conditions.   ATTESTASTION STATEMENTS:  Reviewed by clinician on day of visit: allergies, medications, problem list, medical history, surgical history, family history, social history, and previous encounter notes.   Time spent on visit including pre-visit chart review and post-visit care and charting was 40+ minutes.    Theodis Sato. Dellia Donnelly FNP-C

## 2023-06-10 NOTE — Progress Notes (Signed)
Established patient visit   Patient: Kendra Bennett   DOB: February 24, 1981   43 y.o. Female  MRN: [id number] Visit Date: 06/10/2023  Today's healthcare provider: Alfredia Ferguson, PA-C   Cc. Dizziness, chest pain  Subjective     Pt reports some dizziness when she is squatting, and some intermittent chest pain x 1 mo. She appreciates this pain more at work, or when finishing her notes at home.   She reports the dizziness is a brief wooziness when going from squatting to standing. She stands most of the time at work.   The chest pain occurs mostly while driving-- she reports she often feels rushed in between appointments. 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   Denies chest pain with exertion. Denies persistent, > 5 minutes chest pain,  Medications: Outpatient Medications Prior to Visit  Medication Sig   cetirizine (ZYRTEC) 10 MG tablet Take 10 mg by mouth daily.   clobetasol ointment (TEMOVATE) 0.05 % Use 2 x/day to thick areas   desonide (DESOWEN) 0.05 % cream Apply to the face 1-2 times a day as needed for atopic dermatitis   DUPIXENT 300 MG/2ML prefilled syringe Inject into the skin.   fluticasone (FLONASE) 50 MCG/ACT nasal spray Place 2 sprays into both nostrils daily.   MAGNESIUM PO Take by mouth.   norethindrone (MICRONOR) 0.35 MG tablet Take 1 tablet (0.35 mg total) by mouth daily.   olmesartan (BENICAR) 20 MG tablet Take 1 tablet (20 mg total) by mouth daily.   tirzepatide (MOUNJARO) 7.5 MG/0.5ML Pen Inject 7.5 mg into the skin once a week.   VITAMIN D PO Take by mouth.   No facility-administered medications prior to visit.    Review of Systems  Constitutional:  Negative for fatigue and fever.  Respiratory:  Negative for cough and shortness of breath.   Cardiovascular:  Positive for chest pain. Negative for leg swelling.  Gastrointestinal:  Negative for abdominal pain.  Neurological:  Positive for dizziness. Negative for headaches.       Objective    BP 122/79   Pulse  85   Ht 5\' 1"  (1.549 m)   Wt 185 lb 12.8 oz (84.3 kg)   LMP 05/07/2023 (Approximate)   BMI 35.11 kg/m    Physical Exam Constitutional:      General: She is awake.     Appearance: She is well-developed.  HENT:     Head: Normocephalic.  Eyes:     Conjunctiva/sclera: Conjunctivae normal.  Cardiovascular:     Rate and Rhythm: Normal rate and regular rhythm.     Heart sounds: Normal heart sounds.  Pulmonary:     Effort: Pulmonary effort is normal.     Breath sounds: Normal breath sounds.  Skin:    General: Skin is warm.  Neurological:     Mental Status: She is alert and oriented to person, place, and time.  Psychiatric:        Attention and Perception: Attention normal.        Mood and Affect: Mood is anxious.        Speech: Speech normal.        Behavior: Behavior is cooperative.     No results found for any visits on 06/10/23.  Assessment & Plan    Orthostatic dizziness  Chest pain, unspecified type   Advised pt dizziness sounds orthostatic-- she is on BP meds and her pressure over the last year has run on the low end. Encouraged increased fluid consumption. If  dizziness persists could 1/2 her dose of omlesartan back to 10 mg .  Advised chest pain sounds anxiety mediated, we discussed her overall levels of stress, anxiety, lack of work life balance, and performance anxiety.  Reviewed warning signs of chest pain and when to seek emergency care.   Briefly discussed anxiety management with medication/therapy, pt declines for now.  Return if symptoms worsen or fail to improve.       Alfredia Ferguson, PA-C  [facility] [phone] (phone) [phone] (fax)  [facility]

## 2023-06-10 NOTE — Telephone Encounter (Signed)
Copied from CRM [id number]. Topic: Clinical - Red Word Triage >> Jun 10, 2023 12:52 PM Irine Seal wrote: [facility] that prompted transfer to Nurse Triage: dizziness while  bending, intermittent chest pain, having mild discomfort in the left ear, symptoms present 1 month   Chief Complaint: Dizziness after squatting down and standing back up Symptoms: chest pain a week ago, dizziness when standing after squatting down Frequency: x 1 month Pertinent Negatives: Patient denies nausea, vomiting, diarrhea, fevers, cough Disposition: [] ED /[] Urgent Care (no appt availability in office) / [x] Appointment(In office/virtual)/ []  [facility]/ [] Home Care/ [] Refused Recommended Disposition /[] [facility]/ []  Follow-up with PCP Additional Notes: Patient called and advised that she had an appointment today with [facility] and she talked to them about her recent dizziness and chest pain episodes.  They encouraged her to follow up with her PCP about these concerns. Patient denies any chest pain at this time, dizziness at this time, nausea, vomiting, diarrhea, fevers, or cough. Every time she squats down she feels dizzy when she stands up. Patient also stated that when she is stressed she experiences some occasional chest pains.  She thinks she may be anxiety. Patient states that her blood pressure has been good and her heart rate has been good.  She checked her heart rate while on the phone with this RN and it was about 80 bpm. Patient states she does have a history of acid reflux. She bought some Rolaids last week and it helped her feel better. Lately after standing it takes a few seconds then she gets dizzy then it goes away. Patient is given Care Advice for Chest Pain and for Dizziness as per protocols. Appointment made for today 06/10/2023 at 2 pm with Alfredia Ferguson.   Patient is also advised that if anything worsens to go to the emergency room.  Patient verbalized  understanding.  Reason for Disposition  [1] Chest pain lasts < 5 minutes AND [2] NO chest pain or cardiac symptoms (e.g., breathing difficulty, sweating) now  (Exception: Chest pains that last only a few seconds.)  [1] MODERATE dizziness (e.g., interferes with normal activities) AND [2] has NOT been evaluated by doctor (or NP/PA) for this  (Exception: Dizziness caused by heat exposure, sudden standing, or poor fluid intake.)  Answer Assessment - Initial Assessment Questions 1. LOCATION: "Where does it hurt?"       None now but a week ago some chest pain with stress lasted a few seconds 2. RADIATION: "Does the pain go anywhere else?" (e.g., into neck, jaw, arms, back)     "Just in chest" 3. ONSET: "When did the chest pain begin?" (Minutes, hours or days)      A week ago for a few seconds 4. PATTERN: "Does the pain come and go, or has it been constant since it started?"  "Does it get worse with exertion?"      With stress 5. DURATION: "How long does it last" (e.g., seconds, minutes, hours)     A few seconds 6. SEVERITY: "How bad is the pain?"  (e.g., Scale 1-10; mild, moderate, or severe)    - MILD (1-3): doesn't interfere with normal activities     - MODERATE (4-7): interferes with normal activities or awakens from sleep    - SEVERE (8-10): excruciating pain, unable to do any normal activities       Zero now but at time of episode it was 1-2 7. CARDIAC RISK FACTORS: "Do you have any  history of heart problems or risk factors for heart disease?" (e.g., angina, prior heart attack; diabetes, high blood pressure, high cholesterol, smoker, or strong family history of heart disease)     High blood pressure 8. PULMONARY RISK FACTORS: "Do you have any history of lung disease?"  (e.g., blood clots in lung, asthma, emphysema, birth control pills)     no 9. CAUSE: "What do you think is causing the chest pain?"     "Stress" 10. OTHER SYMPTOMS: "Do you have any other symptoms?" (e.g., dizziness, nausea,  vomiting, sweating, fever, difficulty breathing, cough)       Dizziness when standing after squatting down 11. PREGNANCY: "Is there any chance you are pregnant?" "When was your last menstrual period?"       No-last month  Answer Assessment - Initial Assessment Questions 1. DESCRIPTION: "Describe your dizziness."     Light headed, nothing spinning--pt feels like circulation in legs get impaired when squatting 2. LIGHTHEADED: "Do you feel lightheaded?" (e.g., somewhat faint, woozy, weak upon standing)     Light headed 3. VERTIGO: "Do you feel like either you or the room is spinning or tilting?" (i.e. vertigo)     no 4. SEVERITY: "How bad is it?"  "Do you feel like you are going to faint?" "Can you stand and walk?"   - MILD: Feels slightly dizzy, but walking normally.   - MODERATE: Feels unsteady when walking, but not falling; interferes with normal activities (e.g., school, work).   - SEVERE: Unable to walk without falling, or requires assistance to walk without falling; feels like passing out now.      Just for a few seconds, unsteady but not falling--stands still until it passes 5. ONSET:  "When did the dizziness begin?"     About a month ago  off and on episodes 6. AGGRAVATING FACTORS: "Does anything make it worse?" (e.g., standing, change in head position)     Squatting down and then standing up 7. HEART RATE: "Can you tell me your heart rate?" "How many beats in 15 seconds?"  (Note: not all patients can do this)       "Always been normal"  Patient checked it while on the phone with this RN and it was about 80 bpm. 8. CAUSE: "What do you think is causing the dizziness?"     unknown 9. RECURRENT SYMPTOM: "Have you had dizziness before?" If Yes, ask: "When was the last time?" "What happened that time?"     Squatting and standing 10. OTHER SYMPTOMS: "Do you have any other symptoms?" (e.g., fever, chest pain, vomiting, diarrhea, bleeding)       no 11. PREGNANCY: "Is there any chance you  are pregnant?" "When was your last menstrual period?"       No--last month  Protocols used: Chest Pain-A-AH, Dizziness - Lightheadedness-A-AH

## 2023-06-17 ENCOUNTER — Encounter: Payer: Self-pay | Admitting: Nurse Practitioner

## 2023-07-08 ENCOUNTER — Other Ambulatory Visit: Payer: Self-pay | Admitting: Nurse Practitioner

## 2023-07-08 DIAGNOSIS — E119 Type 2 diabetes mellitus without complications: Secondary | ICD-10-CM

## 2023-07-08 MED ORDER — TIRZEPATIDE 7.5 MG/0.5ML ~~LOC~~ SOAJ
7.5000 mg | SUBCUTANEOUS | 0 refills | Status: DC
Start: 2023-07-08 — End: 2023-08-09

## 2023-08-09 ENCOUNTER — Ambulatory Visit (INDEPENDENT_AMBULATORY_CARE_PROVIDER_SITE_OTHER): Admitting: Nurse Practitioner

## 2023-08-09 ENCOUNTER — Encounter: Payer: Self-pay | Admitting: Nurse Practitioner

## 2023-08-09 VITALS — BP 114/80 | HR 63 | Temp 98.0°F | Ht 61.0 in | Wt 187.0 lb

## 2023-08-09 DIAGNOSIS — E282 Polycystic ovarian syndrome: Secondary | ICD-10-CM

## 2023-08-09 DIAGNOSIS — Z7985 Long-term (current) use of injectable non-insulin antidiabetic drugs: Secondary | ICD-10-CM

## 2023-08-09 DIAGNOSIS — E66812 Obesity, class 2: Secondary | ICD-10-CM

## 2023-08-09 DIAGNOSIS — Z6835 Body mass index (BMI) 35.0-35.9, adult: Secondary | ICD-10-CM

## 2023-08-09 DIAGNOSIS — E119 Type 2 diabetes mellitus without complications: Secondary | ICD-10-CM | POA: Diagnosis not present

## 2023-08-09 DIAGNOSIS — Z7984 Long term (current) use of oral hypoglycemic drugs: Secondary | ICD-10-CM

## 2023-08-09 MED ORDER — TIRZEPATIDE 7.5 MG/0.5ML ~~LOC~~ SOAJ: 7.5000 mg | SUBCUTANEOUS | 0 refills | Status: DC

## 2023-08-09 MED ORDER — METFORMIN HCL 500 MG PO TABS: 500.0000 mg | ORAL_TABLET | Freq: Every day | ORAL | 0 refills | Status: DC

## 2023-08-09 NOTE — Progress Notes (Signed)
 Office: [phone]  /  Fax: [phone]  WEIGHT SUMMARY AND BIOMETRICS  Weight Lost Since Last Visit: 0lb  Weight Gained Since Last Visit: 7lb   Vitals Temp: 98 F (36.7 C) BP: 114/80 Pulse Rate: 63 SpO2: 97 %   Anthropometric Measurements Height: 5\' 1"  (1.549 m) Weight: 187 lb (84.8 kg) BMI (Calculated): 35.35 Weight at Last Visit: 180lb Weight Lost Since Last Visit: 0lb Weight Gained Since Last Visit: 7lb Starting Weight: 187lb Total Weight Loss (lbs): 0 lb (0 kg)   Body Composition  Body Fat %: 40.9 % Fat Mass (lbs): 76.6 lbs Muscle Mass (lbs): 105 lbs Total Body Water (lbs): 76.6 lbs Visceral Fat Rating : 10   Other Clinical Data Fasting: No Labs: No Today's Visit #: 17 Starting Date: 01/12/22     HPI  Chief Complaint: OBESITY  Kendra Bennett is here to discuss her progress with her obesity treatment plan. She is on the the Category 2 Plan and states she is following her eating plan approximately 20 % of the time. She states she is exercising 30 minutes 3 days per week.   Interval History:  Since last office visit she has gained 7 pounds.  She notes she has been eating a lot of cookies.  She states she doesn't know what to do.  Cookies are "my comfort.  It really calms me down when I eat a cookie."  She has been eating more vegetables and trying to eat more, real foods.  She has decreased her processed foods.  She is  not longer complaining of dizziness.  She is drinking water and liquid IV daily.    Pharmacotherapy for weight loss: She is not currently taking medications  for medical weight loss.    Previous pharmacotherapy for medical weight loss:  Wellbutrin-stopped due to side effects of blurry vision.   Bariatric surgery:  Patient has not had bariatric surgery.   Pharmacotherapy for DMT2:  She is currently taking Mounjaro 7.5 mg.  Denies side effects.   Last A1c was 5.9 CBGs: She is not checking BS at home.   Episodes of hypoglycemia: no On  ARB, ASA 81mg  (Benicar 20mg ) Last eye exam:  01/2023 She has tried Trulicity and Metformin in the past.  She stopped Metformin due to side effects of diarrhea.  Did take years ago prior and didn't have side effects.  She is requesting to restart taking Metformin again.  She is struggling with polyphagia and cravings.    Lab Results  Component Value Date   HGBA1C 5.9 (H) 04/01/2023   HGBA1C 6.2 12/18/2022   HGBA1C 6.9 (H) 06/03/2022   Lab Results  Component Value Date   LDLCALC 111 (H) 12/18/2022   CREATININE 0.86 04/01/2023    PCOS Saw GYN last 07/2022.  Stopped BCPs one month ago due to needing a follow up appt.  Took Metformin twice in the past. Stopped one time due to diarrhea.    PHYSICAL EXAM:  Blood pressure 114/80, pulse 63, temperature 98 F (36.7 C), height 5\' 1"  (1.549 m), weight 187 lb (84.8 kg), last menstrual period 06/25/2023, SpO2 97%. Body mass index is 35.33 kg/m.  General: She is overweight, cooperative, alert, well developed, and in no acute distress. PSYCH: Has normal mood, affect and thought process.   Extremities: No edema.  Neurologic: No gross sensory or motor deficits. No tremors or fasciculations noted.    DIAGNOSTIC DATA REVIEWED:  BMET    Component Value Date/Time   NA 139 04/01/2023 2956  K 4.3 04/01/2023 0833   CL 102 04/01/2023 0833   CO2 21 04/01/2023 0833   GLUCOSE 102 (H) 04/01/2023 0833   GLUCOSE 94 12/18/2022 0932   BUN 16 04/01/2023 0833   CREATININE 0.86 04/01/2023 0833   CREATININE 0.61 09/05/2021 1453   CALCIUM 9.6 04/01/2023 0833   GFRNONAA >60 08/04/2015 1325   GFRAA >60 08/04/2015 1325   Lab Results  Component Value Date   HGBA1C 5.9 (H) 04/01/2023   HGBA1C 7.1 (H) 09/05/2021   Lab Results  Component Value Date   INSULIN 23.1 01/12/2022   Lab Results  Component Value Date   TSH 1.910 12/31/2022   CBC    Component Value Date/Time   WBC 8.9 12/31/2022 0901   WBC 11.7 (H) 12/18/2022 0932   RBC 5.23 12/31/2022  0901   RBC 5.35 (H) 12/18/2022 0932   HGB 14.6 12/31/2022 0901   HCT 44.7 12/31/2022 0901   PLT 294 12/31/2022 0901   MCV 86 12/31/2022 0901   MCH 27.9 12/31/2022 0901   MCH 27.7 09/05/2021 1453   MCHC 32.7 12/31/2022 0901   MCHC 32.6 12/18/2022 0932   RDW 13.3 12/31/2022 0901   Iron Studies    Component Value Date/Time   IRON 53 12/31/2022 0901   FERRITIN 43 12/31/2022 0901   Lipid Panel     Component Value Date/Time   CHOL 170 12/18/2022 0932   CHOL 170 01/12/2022 0856   TRIG 119.0 12/18/2022 0932   HDL 35.10 (L) 12/18/2022 0932   HDL 48 01/12/2022 0856   CHOLHDL 5 12/18/2022 0932   VLDL 23.8 12/18/2022 0932   LDLCALC 111 (H) 12/18/2022 0932   LDLCALC 99 01/12/2022 0856   Hepatic Function Panel     Component Value Date/Time   PROT 7.2 04/01/2023 0833   ALBUMIN 4.2 04/01/2023 0833   AST 17 04/01/2023 0833   ALT 14 04/01/2023 0833   ALKPHOS 77 04/01/2023 0833   BILITOT 0.6 04/01/2023 0833      Component Value Date/Time   TSH 1.910 12/31/2022 0901   Nutritional Lab Results  Component Value Date   VD25OH 62.39 12/18/2022   VD25OH 40.8 06/03/2022   VD25OH 21.3 (L) 01/12/2022     ASSESSMENT AND PLAN  TREATMENT PLAN FOR OBESITY:  Recommended Dietary Goals  Kendra Bennett is currently in the action stage of change. As such, her goal is to continue weight management plan. She has agreed to keeping a food journal and adhering to recommended goals of 1200-1300 calories and 80+ grans protein.  Behavioral Intervention  We discussed the following Behavioral Modification Strategies today: increasing lean protein intake to established goals, decreasing simple carbohydrates , increasing vegetables, increasing water intake , work on meal planning and preparation, work on tracking and journaling calories using tracking application, reading food labels , keeping healthy foods at home, and continue to work on maintaining a reduced calorie state, getting the recommended amount  of protein, incorporating whole foods, making healthy choices, staying well hydrated and practicing mindfulness when eating..  Additional resources provided today: NA  Recommended Physical Activity Goals  Lastacia has been advised to work up to 150 minutes of moderate intensity aerobic activity a week and strengthening exercises 2-3 times per week for cardiovascular health, weight loss maintenance and preservation of muscle mass.   She has agreed to Think about enjoyable ways to increase daily physical activity and overcoming barriers to exercise, Increase physical activity in their day and reduce sedentary time (increase NEAT)., Increase the intensity, frequency  or duration of strengthening exercises , and Increase the intensity, frequency or duration of aerobic exercises     ASSOCIATED CONDITIONS ADDRESSED TODAY  Action/Plan  Type 2 diabetes mellitus without complication, without long-term current use of insulin (HCC) -     Tirzepatide; Inject 7.5 mg into the skin once a week.  Dispense: 2 mL; Refill: 0 -     metFORMIN HCl; Take 1 tablet (500 mg total) by mouth daily with breakfast.  Dispense: 30 tablet; Refill: 0. Side effects discussed.  Discussed the increased possibility of getting pregnant with adding Metformin. Needs to use condoms if sexually active.    PCOS (polycystic ovarian syndrome) -     metFORMIN HCl; Take 1 tablet (500 mg total) by mouth daily with breakfast.  Dispense: 30 tablet; Refill: 0 To make follow up appt with GYN.    Class 2 severe obesity due to excess calories with serious comorbidity and body mass index (BMI) of 35.0 to 35.9 in adult [facility])     Will obtain labs at next visit.    Next visit consider:  increasing Metformin and/or Mounjaro.    Will return in 2 months due to increase in co pays.     Return in about 8 weeks (around 10/04/2023).Aaron Aas She was informed of the importance of frequent follow up visits to maximize her success with intensive lifestyle  modifications for her multiple health conditions.   ATTESTASTION STATEMENTS:  Reviewed by clinician on day of visit: allergies, medications, problem list, medical history, surgical history, family history, social history, and previous encounter notes.      Crist Dominion. Rosealyn Little FNP-C

## 2023-08-27 ENCOUNTER — Encounter: Payer: Self-pay | Admitting: Family

## 2023-08-27 ENCOUNTER — Other Ambulatory Visit: Payer: Self-pay | Admitting: Family

## 2023-08-27 ENCOUNTER — Ambulatory Visit: Admitting: Family

## 2023-08-27 VITALS — BP 114/62 | HR 71 | Temp 98.1°F | Ht 61.0 in | Wt 186.0 lb

## 2023-08-27 DIAGNOSIS — J209 Acute bronchitis, unspecified: Secondary | ICD-10-CM | POA: Diagnosis not present

## 2023-08-27 MED ORDER — PROMETHAZINE-DM 6.25-15 MG/5ML PO SYRP: 5.0000 mL | ORAL_SOLUTION | Freq: Four times a day (QID) | ORAL | 0 refills | Status: DC | PRN

## 2023-08-27 MED ORDER — AZITHROMYCIN 250 MG PO TABS: ORAL_TABLET | ORAL | 0 refills | Status: DC

## 2023-08-27 NOTE — Progress Notes (Signed)
 Kendra Bennett is a 43 y.o. female with the following history as recorded in [employer]:  Patient Active Problem List   Diagnosis Date Noted   Generalized obesity 06/03/2022   Fatty liver 06/03/2022   Vitamin D  insufficiency 01/26/2022   Other fatigue 01/12/2022   SOB (shortness of breath) on exertion 01/12/2022   PCOS (polycystic ovarian syndrome) 01/12/2022   Essential hypertension 01/12/2022   Type 2 diabetes mellitus with obesity (HCC) 01/12/2022   Health care maintenance 01/12/2022   B12 deficiency 01/12/2022   Vitamin D  deficiency 01/12/2022   Class 2 severe obesity with serious comorbidity and body mass index (BMI) of 35.0 to 35.9 in adult [facility]) 01/12/2022   Intrinsic atopic dermatitis 12/04/2020    Current Outpatient Medications  Medication Sig Dispense Refill   azithromycin (ZITHROMAX) 250 MG tablet Take 2 tab po the first day then take 1 tablet po daily for 4 days 6 tablet 0   cetirizine (ZYRTEC) 10 MG tablet Take 10 mg by mouth daily.     clobetasol ointment (TEMOVATE) 0.05 % Use 2 x/day to thick areas     desonide (DESOWEN) 0.05 % cream Apply to the face 1-2 times a day as needed for atopic dermatitis     DUPIXENT 300 MG/2ML prefilled syringe Inject into the skin.     fexofenadine (ALLEGRA) 30 MG/5ML suspension Take 30 mg by mouth daily.     fluticasone  (FLONASE ) 50 MCG/ACT nasal spray Place 2 sprays into both nostrils daily. 16 g 1   MAGNESIUM PO Take by mouth.     metFORMIN  (GLUCOPHAGE ) 500 MG tablet Take 1 tablet (500 mg total) by mouth daily with breakfast. 30 tablet 0   olmesartan  (BENICAR ) 20 MG tablet Take 1 tablet (20 mg total) by mouth daily. 90 tablet 3   promethazine-dextromethorphan (PROMETHAZINE-DM) 6.25-15 MG/5ML syrup Take 5 mLs by mouth 4 (four) times daily as needed. 118 mL 0   tirzepatide  (MOUNJARO ) 7.5 MG/0.5ML Pen Inject 7.5 mg into the skin once a week. 2 mL 0   VITAMIN D  PO Take by mouth.     No current facility-administered medications for this  visit.    Allergies: Ciprofloxacin, Wellbutrin  [bupropion ], Penicillins, and Prednisolone  Past Medical History:  Diagnosis Date   Anxiety    Diabetes (HCC)    Eczema    High blood pressure    PCOS (polycystic ovarian syndrome)    Vitamin D  deficiency     Past Surgical History:  Procedure Laterality Date   BREAST BIOPSY Right 04/08/2023   US  RT BREAST BX W LOC DEV 1ST LESION IMG BX SPEC US  GUIDE 04/08/2023 [facility]   BREAST BIOPSY Right 04/08/2023   US  RT BREAST BX W LOC DEV EA ADD LESION IMG BX SPEC US  GUIDE 04/08/2023 [facility]   WISDOM TOOTH EXTRACTION      Family History  Problem Relation Age of Onset   High blood pressure Mother    Heart disease Mother    Anxiety disorder Mother    Diabetes Father    High blood pressure Father    High Cholesterol Father    Kidney disease Father     Social History   Tobacco Use   Smoking status: Never   Smokeless tobacco: Not on file  Substance Use Topics   Alcohol use: No    Subjective:   5-6 day history of cough/ congestion; home COVID test was negative earlier this week; had been using left over prescription for Promethazine with limited benefit; per patient,  she felt feverish this morning;   Objective:  Vitals:   08/27/23 1327  BP: 114/62  Pulse: 71  Temp: 98.1 F (36.7 C)  TempSrc: Oral  SpO2: 99%  Weight: 186 lb (84.4 kg)  Height: 5\' 1"  (1.549 m)    General: Well developed, well nourished, in no acute distress  Skin : Warm and dry.  Head: Normocephalic and atraumatic  Eyes: Sclera and conjunctiva clear; pupils round and reactive to light; extraocular movements intact  Ears: External normal; canals clear; tympanic membranes normal  Oropharynx: Pink, supple. No suspicious lesions  Neck: Supple without thyromegaly, adenopathy  Lungs: Respirations unlabored; clear to auscultation bilaterally without wheeze, rales, rhonchi  CVS exam: normal rate and regular rhythm.  Neurologic: Alert and oriented;  speech intact; face symmetrical; moves all extremities well; CNII-XII intact without focal deficit   Assessment:  1. Acute bronchitis, unspecified organism     Plan:  Rx for Z-pak #1 take as directed; Rx for Promethazine DM q 6 hours prn; increase fluids, rest and follow up worse, no better.   No follow-ups on file.  No orders of the defined types were placed in this encounter.   Requested Prescriptions   Signed Prescriptions Disp Refills   azithromycin (ZITHROMAX) 250 MG tablet 6 tablet 0    Sig: Take 2 tab po the first day then take 1 tablet po daily for 4 days   promethazine-dextromethorphan (PROMETHAZINE-DM) 6.25-15 MG/5ML syrup 118 mL 0    Sig: Take 5 mLs by mouth 4 (four) times daily as needed.

## 2023-08-31 ENCOUNTER — Other Ambulatory Visit: Payer: Self-pay | Admitting: Nurse Practitioner

## 2023-08-31 DIAGNOSIS — E282 Polycystic ovarian syndrome: Secondary | ICD-10-CM

## 2023-08-31 DIAGNOSIS — E119 Type 2 diabetes mellitus without complications: Secondary | ICD-10-CM

## 2023-09-06 ENCOUNTER — Encounter: Payer: Self-pay | Admitting: Nurse Practitioner

## 2023-09-07 ENCOUNTER — Other Ambulatory Visit: Payer: Self-pay | Admitting: Nurse Practitioner

## 2023-09-07 DIAGNOSIS — E282 Polycystic ovarian syndrome: Secondary | ICD-10-CM

## 2023-09-07 DIAGNOSIS — E119 Type 2 diabetes mellitus without complications: Secondary | ICD-10-CM

## 2023-09-07 MED ORDER — METFORMIN HCL 500 MG PO TABS: 500.0000 mg | ORAL_TABLET | Freq: Every day | ORAL | 0 refills | Status: DC

## 2023-09-08 ENCOUNTER — Other Ambulatory Visit: Payer: Self-pay | Admitting: Nurse Practitioner

## 2023-09-08 DIAGNOSIS — E119 Type 2 diabetes mellitus without complications: Secondary | ICD-10-CM

## 2023-09-08 MED ORDER — TIRZEPATIDE 10 MG/0.5ML ~~LOC~~ SOAJ: 10.0000 mg | SUBCUTANEOUS | 0 refills | Status: DC

## 2023-10-06 ENCOUNTER — Other Ambulatory Visit: Payer: Self-pay | Admitting: Nurse Practitioner

## 2023-10-06 DIAGNOSIS — E119 Type 2 diabetes mellitus without complications: Secondary | ICD-10-CM

## 2023-10-06 DIAGNOSIS — E282 Polycystic ovarian syndrome: Secondary | ICD-10-CM

## 2023-10-13 ENCOUNTER — Ambulatory Visit: Admitting: Nurse Practitioner

## 2023-10-13 ENCOUNTER — Encounter: Payer: Self-pay | Admitting: Nurse Practitioner

## 2023-10-13 VITALS — BP 100/69 | HR 54 | Temp 98.2°F | Ht 61.0 in | Wt 184.0 lb

## 2023-10-13 DIAGNOSIS — E282 Polycystic ovarian syndrome: Secondary | ICD-10-CM

## 2023-10-13 DIAGNOSIS — E66811 Obesity, class 1: Secondary | ICD-10-CM | POA: Diagnosis not present

## 2023-10-13 DIAGNOSIS — Z6834 Body mass index (BMI) 34.0-34.9, adult: Secondary | ICD-10-CM

## 2023-10-13 DIAGNOSIS — E119 Type 2 diabetes mellitus without complications: Secondary | ICD-10-CM | POA: Diagnosis not present

## 2023-10-13 DIAGNOSIS — Z7984 Long term (current) use of oral hypoglycemic drugs: Secondary | ICD-10-CM

## 2023-10-13 DIAGNOSIS — Z7985 Long-term (current) use of injectable non-insulin antidiabetic drugs: Secondary | ICD-10-CM

## 2023-10-13 MED ORDER — TIRZEPATIDE 10 MG/0.5ML ~~LOC~~ SOAJ
10.0000 mg | SUBCUTANEOUS | 1 refills | Status: DC
Start: 2023-10-13 — End: 2023-12-22

## 2023-10-13 MED ORDER — METFORMIN HCL 500 MG PO TABS: 500.0000 mg | ORAL_TABLET | Freq: Every day | ORAL | 1 refills | Status: DC

## 2023-10-13 NOTE — Progress Notes (Signed)
 Office: [phone]  /  Fax: [phone]  WEIGHT SUMMARY AND BIOMETRICS  Weight Lost Since Last Visit: 3lb  Weight Gained Since Last Visit: 0lb   Vitals Temp: 98.2 F (36.8 C) BP: 100/69 Pulse Rate: (!) 54 SpO2: 98 %   Anthropometric Measurements Height: 5' 1 (1.549 m) Weight: 184 lb (83.5 kg) BMI (Calculated): 34.78 Weight at Last Visit: 187lb Weight Lost Since Last Visit: 3lb Weight Gained Since Last Visit: 0lb Starting Weight: 187lb Total Weight Loss (lbs): 3 lb (1.361 kg)   Body Composition  Body Fat %: 40.1 % Fat Mass (lbs): 74 lbs Muscle Mass (lbs): 105 lbs Total Body Water (lbs): 76.2 lbs Visceral Fat Rating : 10   Other Clinical Data Fasting: Yes Labs: No Today's Visit #: 18 Starting Date: 01/12/22     HPI  Chief Complaint: OBESITY  Kendra Bennett is here to discuss her progress with her obesity treatment plan. She is on the the Category 2 Plan and states she is following her eating plan approximately 70 % of the time. She states she is exercising 30 minutes 1-2 days per week.   Interval History:  Since last office visit she has lost 3 pounds.  She finds that I'm eating better and has control of what I eat.  She feels overall better since stopping her BCP.  Cravings have improved.  She is trying to drink more water.  She hasn't been walking as much as she has due to her husband's hours at work.  She doesn't want to walk alone.    BF:  2-3 eggs with 1/3 cup rice Snack:  protein shake Lunch:  protein bar Snack:  protein (burger, chicken), vegetables, rice Drinks:  water, liquid iv  She states she doesn't like fruits.    Pharmacotherapy for weight loss: She is not currently taking medications  for medical weight loss.    Previous pharmacotherapy for medical weight loss:  Wellbutrin -stopped due to side effects of blurry vision.   Bariatric surgery:  Patient has not had bariatric surgery.  Pharmacotherapy for DMT2/PCOS She is currently  taking Mounjaro  10 mg and Metformin  500mg .  Denies side effects.   Last A1c was 5.9 CBGs: She is not checking BS at home.   Episodes of hypoglycemia: Denies On ARB, ASA 81mg  (Benicar  20mg ) Last eye exam:  scheduled in July She has tried Trulicity  and Metformin  in the past.  She stopped Metformin  due to side effects of diarrhea.  Did take years ago prior and didn't have side effects.   She is struggling with polyphagia and cravings.  Lab Results  Component Value Date   HGBA1C 5.9 (H) 04/01/2023   HGBA1C 6.2 12/18/2022   HGBA1C 6.9 (H) 06/03/2022   Lab Results  Component Value Date   LDLCALC 111 (H) 12/18/2022   CREATININE 0.86 04/01/2023      PHYSICAL EXAM:  Blood pressure 100/69, pulse (!) 54, temperature 98.2 F (36.8 C), height 5' 1 (1.549 m), weight 184 lb (83.5 kg), last menstrual period 09/14/2023, SpO2 98%. Body mass index is 34.77 kg/m.  General: She is overweight, cooperative, alert, well developed, and in no acute distress. PSYCH: Has normal mood, affect and thought process.   Extremities: No edema.  Neurologic: No gross sensory or motor deficits. No tremors or fasciculations noted.    DIAGNOSTIC DATA REVIEWED:  BMET    Component Value Date/Time   NA 139 04/01/2023 0833   K 4.3 04/01/2023 0833   CL 102 04/01/2023 0833   CO2 21  04/01/2023 0833   GLUCOSE 102 (H) 04/01/2023 0833   GLUCOSE 94 12/18/2022 0932   BUN 16 04/01/2023 0833   CREATININE 0.86 04/01/2023 0833   CREATININE 0.61 09/05/2021 1453   CALCIUM 9.6 04/01/2023 0833   GFRNONAA >60 08/04/2015 1325   GFRAA >60 08/04/2015 1325   Lab Results  Component Value Date   HGBA1C 5.9 (H) 04/01/2023   HGBA1C 7.1 (H) 09/05/2021   Lab Results  Component Value Date   INSULIN  23.1 01/12/2022   Lab Results  Component Value Date   TSH 1.910 12/31/2022   CBC    Component Value Date/Time   WBC 8.9 12/31/2022 0901   WBC 11.7 (H) 12/18/2022 0932   RBC 5.23 12/31/2022 0901   RBC 5.35 (H) 12/18/2022  0932   HGB 14.6 12/31/2022 0901   HCT 44.7 12/31/2022 0901   PLT 294 12/31/2022 0901   MCV 86 12/31/2022 0901   MCH 27.9 12/31/2022 0901   MCH 27.7 09/05/2021 1453   MCHC 32.7 12/31/2022 0901   MCHC 32.6 12/18/2022 0932   RDW 13.3 12/31/2022 0901   Iron Studies    Component Value Date/Time   IRON 53 12/31/2022 0901   FERRITIN 43 12/31/2022 0901   Lipid Panel     Component Value Date/Time   CHOL 170 12/18/2022 0932   CHOL 170 01/12/2022 0856   TRIG 119.0 12/18/2022 0932   HDL 35.10 (L) 12/18/2022 0932   HDL 48 01/12/2022 0856   CHOLHDL 5 12/18/2022 0932   VLDL 23.8 12/18/2022 0932   LDLCALC 111 (H) 12/18/2022 0932   LDLCALC 99 01/12/2022 0856   Hepatic Function Panel     Component Value Date/Time   PROT 7.2 04/01/2023 0833   ALBUMIN 4.2 04/01/2023 0833   AST 17 04/01/2023 0833   ALT 14 04/01/2023 0833   ALKPHOS 77 04/01/2023 0833   BILITOT 0.6 04/01/2023 0833      Component Value Date/Time   TSH 1.910 12/31/2022 0901   Nutritional Lab Results  Component Value Date   VD25OH 62.39 12/18/2022   VD25OH 40.8 06/03/2022   VD25OH 21.3 (L) 01/12/2022     ASSESSMENT AND PLAN  TREATMENT PLAN FOR OBESITY:  Recommended Dietary Goals  Quantisha is currently in the action stage of change. As such, her goal is to continue weight management plan. She has agreed to the Category 2 Plan.  Behavioral Intervention  We discussed the following Behavioral Modification Strategies today: increasing lean protein intake to established goals, increasing vegetables, increasing fiber rich foods, increasing water intake , and continue to work on maintaining a reduced calorie state, getting the recommended amount of protein, incorporating whole foods, making healthy choices, staying well hydrated and practicing mindfulness when eating..  Additional resources provided today: NA  Recommended Physical Activity Goals  Veda has been advised to work up to 150 minutes of moderate  intensity aerobic activity a week and strengthening exercises 2-3 times per week for cardiovascular health, weight loss maintenance and preservation of muscle mass.   She has agreed to Think about enjoyable ways to increase daily physical activity and overcoming barriers to exercise, Increase physical activity in their day and reduce sedentary time (increase NEAT)., Start strengthening exercises with a goal of 2-3 sessions a week , and continue to gradually increase the amount and intensity of exercise routine    ASSOCIATED CONDITIONS ADDRESSED TODAY  Action/Plan  Type 2 diabetes mellitus without complication, without long-term current use of insulin  (HCC) -     Tirzepatide ; Inject  10 mg into the skin once a week.  Dispense: 2 mL; Refill: 1 -     metFORMIN  HCl; Take 1 tablet (500 mg total) by mouth daily with breakfast.  Dispense: 30 tablet; Refill: 1  PCOS (polycystic ovarian syndrome) -     metFORMIN  HCl; Take 1 tablet (500 mg total) by mouth daily with breakfast.  Dispense: 30 tablet; Refill: 1  Class 1 obesity due to excess calories with serious comorbidity and body mass index (BMI) of 34.0 to 34.9 in adult      Will obtain labs in 1-2 months if not obtained at PCP's office-scheduled for CPE 01/20/24   Return in about 8 weeks (around 12/08/2023).Aaron Aas She was informed of the importance of frequent follow up visits to maximize her success with intensive lifestyle modifications for her multiple health conditions.   ATTESTASTION STATEMENTS:  Reviewed by clinician on day of visit: allergies, medications, problem list, medical history, surgical history, family history, social history, and previous encounter notes.     Crist Dominion. Chrystle Murillo FNP-C

## 2023-10-14 ENCOUNTER — Other Ambulatory Visit: Payer: Self-pay | Admitting: Nurse Practitioner

## 2023-10-14 DIAGNOSIS — E282 Polycystic ovarian syndrome: Secondary | ICD-10-CM

## 2023-10-14 DIAGNOSIS — E119 Type 2 diabetes mellitus without complications: Secondary | ICD-10-CM

## 2023-10-14 MED ORDER — METFORMIN HCL 500 MG PO TABS
500.0000 mg | ORAL_TABLET | Freq: Every day | ORAL | 0 refills | Status: DC
Start: 2023-10-14 — End: 2024-01-12

## 2023-10-28 ENCOUNTER — Encounter: Payer: Self-pay | Admitting: Family

## 2023-11-10 ENCOUNTER — Encounter: Payer: Self-pay | Admitting: Obstetrics and Gynecology

## 2023-11-10 ENCOUNTER — Ambulatory Visit: Admitting: Obstetrics and Gynecology

## 2023-11-10 VITALS — BP 109/72 | HR 80 | Ht 61.0 in | Wt 188.0 lb

## 2023-11-10 DIAGNOSIS — Z1211 Encounter for screening for malignant neoplasm of colon: Secondary | ICD-10-CM | POA: Diagnosis not present

## 2023-11-10 DIAGNOSIS — Z01419 Encounter for gynecological examination (general) (routine) without abnormal findings: Secondary | ICD-10-CM | POA: Diagnosis not present

## 2023-11-10 NOTE — Patient Instructions (Addendum)
 It was good seeing you today! If you have any concerns prior to your next appointment please let me know.

## 2023-11-10 NOTE — Progress Notes (Signed)
 ANNUAL EXAM Patient name: Kendra Bennett MRN [id number]  Date of birth: 31-Oct-1980 Chief Complaint:   Gynecologic Exam  History of Present Illness:   Kendra Bennett is a 43 y.o. G0P0000 with Patient's last menstrual period was 10/16/2023 (approximate). being seen today for a routine annual exam.  Current complaints:   PCOS - Stopped the micronor . Started metformin  and has been having regular cycles. Since stopping micronor , she has noticed more hot flashes, sleep disturbances. Is having trouble losing weight but also notes she isn't really exercising. Notes recurrent boils in the groin area   Upstream - 11/10/23 1324       Pregnancy Intention Screening   Does the patient want to become pregnant in the next year? Ok Either Way    Would the patient like to discuss contraceptive options today? No      Contraception Wrap Up   Current Method No Contraceptive Precautions    End Method No Contraception Precautions   Discussed PNV   Contraception Counseling Provided No    How was the end contraceptive method provided? N/A         The pregnancy intention screening data noted above was reviewed. Potential methods of contraception were discussed. The patient elected to proceed with No Contraception Precautions (Discussed PNV).   Last pap 05/24/19. Results were: NILM w/ HRHPV negative. H/O abnormal pap: no Last mammogram: 03/10/23 - required biopsies that were benign, c/w fibrosis & adenosis; plan for annual MMG. Family h/o breast cancer: no Last colonoscopy: never. Family h/o colorectal cancer: no HPV vaccine: no     11/10/2023    9:08 AM 06/10/2023    2:48 PM 12/18/2022    8:48 AM 11/20/2022    1:22 PM 01/12/2022    7:27 AM  Depression screen PHQ 2/9  Decreased Interest 0 0 0 0 3  Down, Depressed, Hopeless 0 0 0 0 0  PHQ - 2 Score 0 0 0 0 3  Altered sleeping 1 0   0  Tired, decreased energy 1 0   3  Change in appetite 0 0   0  Feeling bad or failure about yourself  0 0   0   Trouble concentrating 0 0   0  Moving slowly or fidgety/restless 0 0   0  Suicidal thoughts 0 0   0  PHQ-9 Score 2 0   6  Difficult doing work/chores  Not difficult at all   Not difficult at all        11/10/2023    9:08 AM 06/10/2023    2:48 PM  GAD 7 : Generalized Anxiety Score  Nervous, Anxious, on Edge 0 1  Control/stop worrying 0 0  Worry too much - different things 0 0  Trouble relaxing 0 0  Restless 0 0  Easily annoyed or irritable 0 0  Afraid - awful might happen 0 0  Total GAD 7 Score 0 1  Anxiety Difficulty  Not difficult at all     Review of Systems:   Pertinent items are noted in HPI Denies any headaches, blurred vision, fatigue, shortness of breath, chest pain, abdominal pain, abnormal vaginal discharge/itching/odor/irritation, problems with periods, bowel movements, urination, or intercourse unless otherwise stated above. Pertinent History Reviewed:  Reviewed past medical,surgical, social and family history.  Reviewed problem list, medications and allergies. Physical Assessment:   Vitals:   11/10/23 0851  BP: 109/72  Pulse: 80  Weight: 188 lb (85.3 kg)  Height: 5' 1 (1.549 m)  Body mass  index is 35.52 kg/m.        Physical Examination:   General appearance - well appearing, and in no distress  Mental status - alert, oriented to person, place, and time  Chest - respiratory effort normal  Heart - normal peripheral perfusion  Breasts - Declined  Pelvic - Declined   No results found for this or any previous visit (from the past 24 hours).  Assessment & Plan:  1) Well-Woman Exam Mammogram: due 02/2024 Colonoscopy: referral placed, due this year Pap: due 04/2024 - she will return in ~6 months Gardasil: Discussed pros/cons, declines at this time GC/CT/HIV/HCV: Declined Discussed some of her symptoms may be attributable to menopause transition. Anticipatory guidance provided.   Labs/procedures today:  Orders Placed This Encounter  Procedures    Ambulatory referral to Gastroenterology   Meds: No orders of the defined types were placed in this encounter.  Follow-up: Return in about 6 months (around 05/12/2024) for pap & mammogram.  Kieth JAYSON Carolin, MD 11/10/2023 1:25 PM

## 2023-11-15 ENCOUNTER — Encounter: Payer: Self-pay | Admitting: Nurse Practitioner

## 2023-12-01 LAB — OPHTHALMOLOGY REPORT-SCANNED

## 2023-12-16 ENCOUNTER — Ambulatory Visit: Admitting: Nurse Practitioner

## 2023-12-22 ENCOUNTER — Encounter: Payer: Self-pay | Admitting: Nurse Practitioner

## 2023-12-22 DIAGNOSIS — E119 Type 2 diabetes mellitus without complications: Secondary | ICD-10-CM

## 2023-12-22 MED ORDER — TIRZEPATIDE 10 MG/0.5ML ~~LOC~~ SOAJ: 10.0000 mg | SUBCUTANEOUS | 0 refills | Status: DC

## 2023-12-31 DIAGNOSIS — L03113 Cellulitis of right upper limb: Secondary | ICD-10-CM | POA: Diagnosis not present

## 2024-01-12 ENCOUNTER — Encounter: Payer: Self-pay | Admitting: Nurse Practitioner

## 2024-01-12 ENCOUNTER — Ambulatory Visit: Admitting: Nurse Practitioner

## 2024-01-12 VITALS — BP 101/63 | HR 77 | Temp 98.2°F | Ht 61.0 in | Wt 184.0 lb

## 2024-01-12 DIAGNOSIS — Z79899 Other long term (current) drug therapy: Secondary | ICD-10-CM

## 2024-01-12 DIAGNOSIS — E559 Vitamin D deficiency, unspecified: Secondary | ICD-10-CM

## 2024-01-12 DIAGNOSIS — E119 Type 2 diabetes mellitus without complications: Secondary | ICD-10-CM | POA: Diagnosis not present

## 2024-01-12 DIAGNOSIS — Z6834 Body mass index (BMI) 34.0-34.9, adult: Secondary | ICD-10-CM

## 2024-01-12 DIAGNOSIS — E282 Polycystic ovarian syndrome: Secondary | ICD-10-CM | POA: Diagnosis not present

## 2024-01-12 DIAGNOSIS — I10 Essential (primary) hypertension: Secondary | ICD-10-CM

## 2024-01-12 DIAGNOSIS — Z7985 Long-term (current) use of injectable non-insulin antidiabetic drugs: Secondary | ICD-10-CM

## 2024-01-12 DIAGNOSIS — E66811 Obesity, class 1: Secondary | ICD-10-CM

## 2024-01-12 MED ORDER — METFORMIN HCL 500 MG PO TABS: 500.0000 mg | ORAL_TABLET | Freq: Every day | ORAL | 0 refills | Status: DC

## 2024-01-12 MED ORDER — TIRZEPATIDE 12.5 MG/0.5ML ~~LOC~~ SOAJ: 12.5000 mg | SUBCUTANEOUS | 1 refills | Status: DC

## 2024-01-12 NOTE — Progress Notes (Signed)
 Office: [phone]  /  Fax: [phone]  WEIGHT SUMMARY AND BIOMETRICS  Weight Lost Since Last Visit: 0lb  Weight Gained Since Last Visit: 0lb   Vitals Temp: 98.2 F (36.8 C) BP: 101/63 Pulse Rate: 77 SpO2: 98 %   Anthropometric Measurements Height: 5' 1 (1.549 m) Weight: 184 lb (83.5 kg) BMI (Calculated): 34.78 Weight at Last Visit: 184lb Weight Lost Since Last Visit: 0lb Weight Gained Since Last Visit: 0lb Starting Weight: 187lb Total Weight Loss (lbs): 3 lb (1.361 kg)   Body Composition  Body Fat %: 40.5 % Fat Mass (lbs): 74.8 lbs Muscle Mass (lbs): 104.4 lbs Total Body Water (lbs): 74.8 lbs Visceral Fat Rating : 10   Other Clinical Data Fasting: No Labs: No Today's Visit #: 19 Starting Date: 01/12/22     HPI  Chief Complaint: OBESITY  Kendra Bennett is here to discuss her progress with her obesity treatment plan. She is on the the Category 2 Plan and states she is following her eating plan approximately 80 % of the time. She states she is exercising 20 minutes 3 days per week.   Interval History:  Since last office visit she has maintained her weight.  She is eating what I feel and trying to not over do it on sugar.  She sometime regrets her decisions like I shouldn't have eaten that snicker bar.  She is struggling with cravings and polyphagia especially the day before her injection is due. She started playing pickleball since her last visit.  She recently was treated with doxy and prednisone for an insect bite on her right elbow. She is not drinking enough water.  She is drinking unsweetened tea and coke zero.  She does note that since her initial visit, she overall feels better.    Pharmacotherapy for weight loss: She is not currently taking medications  for medical weight loss.    Previous pharmacotherapy for medical weight loss:  Wellbutrin -stopped due to side effects of blurry vision.   Bariatric surgery:  Patient has not had bariatric  surgery.  Pharmacotherapy for DMT2/PCOS:   She is currently taking Mounjaro  10 mg and Metformin  500mg .  Denies side effects.   Last A1c was 5.9 CBGs: She is not checking BS at home.   Episodes of hypoglycemia: Denies On ARB, ASA 81mg  (Benicar  20mg ) Last eye exam:  Aug 2025 She has tried Trulicity  and Metformin  in the past.  She stopped Metformin  due to side effects of diarrhea.  Did take years ago prior and didn't have side effects.   She is struggling with polyphagia and cravings.  Lab Results  Component Value Date   HGBA1C 5.9 (H) 04/01/2023   HGBA1C 6.2 12/18/2022   HGBA1C 6.9 (H) 06/03/2022   Lab Results  Component Value Date   LDLCALC 111 (H) 12/18/2022   CREATININE 0.86 04/01/2023    Hypertension Hypertension well controlled.  Medication(s): Benicar  20mg .  Side effects discussed.   Denies chest pain, palpitations and SOB.  BP Readings from Last 3 Encounters:  01/12/24 101/63  11/10/23 109/72  10/13/23 100/69   Lab Results  Component Value Date   CREATININE 0.86 04/01/2023   CREATININE 0.76 12/18/2022   CREATININE 0.56 (L) 06/03/2022     Vit D deficiency  She is taking Vit D 2,000 international units  OTC.  Denies side effects.  Denies nausea, vomiting or muscle weakness.    Lab Results  Component Value Date   VD25OH 62.39 12/18/2022   VD25OH 40.8 06/03/2022   VD25OH 21.3 (  L) 01/12/2022     PHYSICAL EXAM:  Blood pressure 101/63, pulse 77, temperature 98.2 F (36.8 C), height 5' 1 (1.549 m), weight 184 lb (83.5 kg), last menstrual period 12/21/2023, SpO2 98%. Body mass index is 34.77 kg/m.  General: She is overweight, cooperative, alert, well developed, and in no acute distress. PSYCH: Has normal mood, affect and thought process.   Extremities: No edema.  Neurologic: No gross sensory or motor deficits. No tremors or fasciculations noted.    DIAGNOSTIC DATA REVIEWED:  BMET    Component Value Date/Time   NA 139 04/01/2023 0833   K 4.3 04/01/2023  0833   CL 102 04/01/2023 0833   CO2 21 04/01/2023 0833   GLUCOSE 102 (H) 04/01/2023 0833   GLUCOSE 94 12/18/2022 0932   BUN 16 04/01/2023 0833   CREATININE 0.86 04/01/2023 0833   CREATININE 0.61 09/05/2021 1453   CALCIUM 9.6 04/01/2023 0833   GFRNONAA >60 08/04/2015 1325   GFRAA >60 08/04/2015 1325   Lab Results  Component Value Date   HGBA1C 5.9 (H) 04/01/2023   HGBA1C 7.1 (H) 09/05/2021   Lab Results  Component Value Date   INSULIN  23.1 01/12/2022   Lab Results  Component Value Date   TSH 1.910 12/31/2022   CBC    Component Value Date/Time   WBC 8.9 12/31/2022 0901   WBC 11.7 (H) 12/18/2022 0932   RBC 5.23 12/31/2022 0901   RBC 5.35 (H) 12/18/2022 0932   HGB 14.6 12/31/2022 0901   HCT 44.7 12/31/2022 0901   PLT 294 12/31/2022 0901   MCV 86 12/31/2022 0901   MCH 27.9 12/31/2022 0901   MCH 27.7 09/05/2021 1453   MCHC 32.7 12/31/2022 0901   MCHC 32.6 12/18/2022 0932   RDW 13.3 12/31/2022 0901   Iron Studies    Component Value Date/Time   IRON 53 12/31/2022 0901   FERRITIN 43 12/31/2022 0901   Lipid Panel     Component Value Date/Time   CHOL 170 12/18/2022 0932   CHOL 170 01/12/2022 0856   TRIG 119.0 12/18/2022 0932   HDL 35.10 (L) 12/18/2022 0932   HDL 48 01/12/2022 0856   CHOLHDL 5 12/18/2022 0932   VLDL 23.8 12/18/2022 0932   LDLCALC 111 (H) 12/18/2022 0932   LDLCALC 99 01/12/2022 0856   Hepatic Function Panel     Component Value Date/Time   PROT 7.2 04/01/2023 0833   ALBUMIN 4.2 04/01/2023 0833   AST 17 04/01/2023 0833   ALT 14 04/01/2023 0833   ALKPHOS 77 04/01/2023 0833   BILITOT 0.6 04/01/2023 0833      Component Value Date/Time   TSH 1.910 12/31/2022 0901   Nutritional Lab Results  Component Value Date   VD25OH 62.39 12/18/2022   VD25OH 40.8 06/03/2022   VD25OH 21.3 (L) 01/12/2022     ASSESSMENT AND PLAN  TREATMENT PLAN FOR OBESITY:  Recommended Dietary Goals  [name] is currently in the action stage of change. As such,  her goal is to continue weight management plan. She has agreed to keeping a food journal and adhering to recommended goals of 1300-1400 calories and 80+ grams of protein.  Today we discussed the importance of decreasing sugary drinks and to track her calories and macros.  I have asked her to send them to me via MyChart and I will review before her next visit.  Would like to make sure she is eating enough protein and calories or seeing if she is able to meeting calories into many fats  or carbs.  Behavioral Intervention  We discussed the following Behavioral Modification Strategies today: increasing lean protein intake to established goals, decreasing simple carbohydrates , increasing vegetables, increasing fiber rich foods, increasing water intake , work on meal planning and preparation, work on tracking and journaling calories using tracking application, reading food labels , keeping healthy foods at home, continue to work on maintaining a reduced calorie state, getting the recommended amount of protein, incorporating whole foods, making healthy choices, staying well hydrated and practicing mindfulness when eating., and increase protein intake, fibrous foods (25 grams per day for women, 30 grams for men) and water to improve satiety and decrease hunger signals. .  Additional resources provided today: NA  Recommended Physical Activity Goals  Keydi has been advised to work up to 150 minutes of moderate intensity aerobic activity a week and strengthening exercises 2-3 times per week for cardiovascular health, weight loss maintenance and preservation of muscle mass.   She has agreed to Think about enjoyable ways to increase daily physical activity and overcoming barriers to exercise, Increase physical activity in their day and reduce sedentary time (increase NEAT)., Start strengthening exercises with a goal of 2-3 sessions a week , Continue to gradually increase the amount and intensity of exercise  routine, Increase volume of physical activity to a goal of 240 minutes a week, and Combine aerobic and strengthening exercises for efficiency and improved cardiometabolic health.   ASSOCIATED CONDITIONS ADDRESSED TODAY  Action/Plan  Type 2 diabetes mellitus without complication, without long-term current use of insulin  (HCC) -     Hemoglobin A1c -     metFORMIN  HCl; Take 1 tablet (500 mg total) by mouth daily with breakfast.  Dispense: 90 tablet; Refill: 0 -     Tirzepatide ; Inject 12.5 mg into the skin once a week.  Dispense: 2 mL; Refill: 1  Essential hypertension -     CBC with Differential/Platelet  PCOS (polycystic ovarian syndrome) -     Hemoglobin A1c -     metFORMIN  HCl; Take 1 tablet (500 mg total) by mouth daily with breakfast.  Dispense: 90 tablet; Refill: 0  Vitamin D  deficiency -     VITAMIN D  25 Hydroxy (Vit-D Deficiency, Fractures)  Medication management -     Comprehensive metabolic panel with GFR -     TSH -     Vitamin B12  Class 1 obesity due to excess calories with serious comorbidity and body mass index (BMI) of 34.0 to 34.9 in adult -     Hemoglobin A1c -     TSH     Patient is not fasting today.  She is seeing her PCP on 01/21/24.  Plans to fast for that visit.       Return in about 3 months (around 04/12/2024).SABRA She was informed of the importance of frequent follow up visits to maximize her success with intensive lifestyle modifications for her multiple health conditions.   ATTESTASTION STATEMENTS:  Reviewed by clinician on day of visit: allergies, medications, problem list, medical history, surgical history, family history, social history, and previous encounter notes.     Corean SAUNDERS. Hakeen Shipes FNP-C

## 2024-01-13 ENCOUNTER — Ambulatory Visit: Payer: Self-pay | Admitting: Nurse Practitioner

## 2024-01-13 LAB — CBC WITH DIFFERENTIAL/PLATELET
Basophils Absolute: 0.1 x10E3/uL (ref 0.0–0.2)
Basos: 1 %
EOS (ABSOLUTE): 0.2 x10E3/uL (ref 0.0–0.4)
Eos: 1 %
Hematocrit: 45.5 % (ref 34.0–46.6)
Hemoglobin: 14.7 g/dL (ref 11.1–15.9)
Immature Grans (Abs): 0.1 x10E3/uL (ref 0.0–0.1)
Immature Granulocytes: 1 %
Lymphocytes Absolute: 2.7 x10E3/uL (ref 0.7–3.1)
Lymphs: 20 %
MCH: 29.2 pg (ref 26.6–33.0)
MCHC: 32.3 g/dL (ref 31.5–35.7)
MCV: 90 fL (ref 79–97)
Monocytes Absolute: 0.6 x10E3/uL (ref 0.1–0.9)
Monocytes: 4 %
Neutrophils Absolute: 9.6 x10E3/uL — ABNORMAL HIGH (ref 1.4–7.0)
Neutrophils: 73 %
Platelets: 290 x10E3/uL (ref 150–450)
RBC: 5.04 x10E6/uL (ref 3.77–5.28)
RDW: 13.1 % (ref 11.7–15.4)
WBC: 13.3 x10E3/uL — ABNORMAL HIGH (ref 3.4–10.8)

## 2024-01-13 LAB — COMPREHENSIVE METABOLIC PANEL WITH GFR
ALT: 15 IU/L (ref 0–32)
AST: 15 IU/L (ref 0–40)
Albumin: 4 g/dL (ref 3.9–4.9)
Alkaline Phosphatase: 79 IU/L (ref 41–116)
BUN/Creatinine Ratio: 17 (ref 9–23)
BUN: 13 mg/dL (ref 6–24)
Bilirubin Total: 0.4 mg/dL (ref 0.0–1.2)
CO2: 19 mmol/L — ABNORMAL LOW (ref 20–29)
Calcium: 9.1 mg/dL (ref 8.7–10.2)
Chloride: 103 mmol/L (ref 96–106)
Creatinine, Ser: 0.76 mg/dL (ref 0.57–1.00)
Globulin, Total: 2.7 g/dL (ref 1.5–4.5)
Glucose: 118 mg/dL — ABNORMAL HIGH (ref 70–99)
Potassium: 4.2 mmol/L (ref 3.5–5.2)
Sodium: 138 mmol/L (ref 134–144)
Total Protein: 6.7 g/dL (ref 6.0–8.5)
eGFR: 98 mL/min/1.73 (ref >59)

## 2024-01-13 LAB — TSH: TSH: 1.72 u[IU]/mL (ref 0.450–4.500)

## 2024-01-13 LAB — VITAMIN B12: Vitamin B-12: >2000 pg/mL — ABNORMAL HIGH (ref 232–1245)

## 2024-01-13 LAB — HEMOGLOBIN A1C
Est. average glucose Bld gHb Est-mCnc: 120 mg/dL
Hgb A1c MFr Bld: 5.8 % — ABNORMAL HIGH (ref 4.8–5.6)

## 2024-01-13 LAB — VITAMIN D 25 HYDROXY (VIT D DEFICIENCY, FRACTURES): Vit D, 25-Hydroxy: 37.2 ng/mL (ref 30.0–100.0)

## 2024-01-20 ENCOUNTER — Encounter: Admitting: Family

## 2024-01-21 ENCOUNTER — Encounter: Payer: Self-pay | Admitting: Family Medicine

## 2024-01-21 ENCOUNTER — Ambulatory Visit (INDEPENDENT_AMBULATORY_CARE_PROVIDER_SITE_OTHER): Admitting: Family Medicine

## 2024-01-21 VITALS — BP 108/55 | HR 74 | Ht 61.0 in | Wt 188.0 lb

## 2024-01-21 DIAGNOSIS — Z0001 Encounter for general adult medical examination with abnormal findings: Secondary | ICD-10-CM

## 2024-01-21 DIAGNOSIS — E119 Type 2 diabetes mellitus without complications: Secondary | ICD-10-CM | POA: Diagnosis not present

## 2024-01-21 DIAGNOSIS — B359 Dermatophytosis, unspecified: Secondary | ICD-10-CM

## 2024-01-21 DIAGNOSIS — Z0000 Encounter for general adult medical examination without abnormal findings: Secondary | ICD-10-CM

## 2024-01-21 LAB — LIPID PANEL
Cholesterol: 168 mg/dL (ref 0–200)
HDL: 37.9 mg/dL — ABNORMAL LOW (ref >39.00)
LDL Cholesterol: 106 mg/dL — ABNORMAL HIGH (ref 0–99)
NonHDL: 129.88
Total CHOL/HDL Ratio: 4
Triglycerides: 118 mg/dL (ref 0.0–149.0)
VLDL: 23.6 mg/dL (ref 0.0–40.0)

## 2024-01-21 LAB — MICROALBUMIN / CREATININE URINE RATIO
Creatinine,U: 150.6 mg/dL
Microalb Creat Ratio: UNDETERMINED mg/g (ref 0.0–30.0)
Microalb, Ur: <0.7 mg/dL

## 2024-01-21 MED ORDER — CLOTRIMAZOLE 1 % EX CREA: 1.0000 | TOPICAL_CREAM | Freq: Two times a day (BID) | CUTANEOUS | 1 refills | Status: AC

## 2024-01-21 NOTE — Progress Notes (Signed)
 Complete physical exam  Patient: Kendra Bennett   DOB: 1980-07-11   43 y.o. Female  MRN: [id number]  Subjective:    Chief Complaint  Patient presents with   Establish Care    Makell Carrick is a 43 y.o. female who presents today for a complete physical exam. She reports consuming a general diet. She is walking daily She generally feels well. She reports sleeping well. She does not have additional problems to discuss today.   Currently lives with: husband Acute concerns or interim problems since last visit:  - she has had about a week or so of red, raised, itchy rash to left neck, minimal response to topical steroids   Vision concerns: none Dental concerns: none   ETOH use: no Nicotine use: no Recreational drugs/illegal substances: no       Most recent fall risk assessment:    01/21/2024    8:58 AM  Fall Risk   Falls in the past year? 0  Number falls in past yr: 0  Injury with Fall? 0  Risk for fall due to : No Fall Risks  Follow up Falls evaluation completed     Most recent depression screenings:    01/21/2024    8:58 AM 11/10/2023    9:08 AM  PHQ 2/9 Scores  PHQ - 2 Score 0 0  PHQ- 9 Score 0 2            Patient Care Team: Jason Leita Repine, FNP as PCP - General (Internal Medicine)   Outpatient Medications Prior to Visit  Medication Sig   cetirizine (ZYRTEC) 10 MG tablet Take 10 mg by mouth daily.   clobetasol ointment (TEMOVATE) 0.05 % Use 2 x/day to thick areas   desonide (DESOWEN) 0.05 % cream Apply to the face 1-2 times a day as needed for atopic dermatitis   DUPIXENT 300 MG/2ML prefilled syringe Inject into the skin.   fluticasone  (FLONASE ) 50 MCG/ACT nasal spray Place 2 sprays into both nostrils daily.   MAGNESIUM PO Take by mouth.   metFORMIN  (GLUCOPHAGE ) 500 MG tablet Take 1 tablet (500 mg total) by mouth daily with breakfast.   olmesartan  (BENICAR ) 20 MG tablet Take 1 tablet (20 mg total) by mouth daily.   tirzepatide   (MOUNJARO ) 12.5 MG/0.5ML Pen Inject 12.5 mg into the skin once a week.   VITAMIN D  PO Take by mouth.   fexofenadine (ALLEGRA) 30 MG/5ML suspension Take 30 mg by mouth daily. (Patient not taking: Reported on 01/21/2024)   No facility-administered medications prior to visit.    ROS All review of systems negative except what is listed in the HPI        Objective:     BP (!) 108/55   Pulse 74   Ht 5' 1 (1.549 m)   Wt 188 lb (85.3 kg)   LMP 12/21/2023 (Exact Date)   SpO2 98%   BMI 35.52 kg/m    Physical Exam Vitals reviewed.  Constitutional:      General: She is not in acute distress.    Appearance: Normal appearance. She is obese. She is not ill-appearing.  HENT:     Head: Normocephalic and atraumatic.     Right Ear: Tympanic membrane normal.     Left Ear: Tympanic membrane normal.     Nose: Nose normal.     Mouth/Throat:     Mouth: Mucous membranes are moist.     Pharynx: Oropharynx is clear.  Eyes:     Extraocular Movements: Extraocular movements  intact.     Conjunctiva/sclera: Conjunctivae normal.     Pupils: Pupils are equal, round, and reactive to light.  Cardiovascular:     Rate and Rhythm: Normal rate and regular rhythm.     Pulses: Normal pulses.     Heart sounds: Normal heart sounds.  Pulmonary:     Effort: Pulmonary effort is normal.     Breath sounds: Normal breath sounds.  Abdominal:     General: Abdomen is flat. Bowel sounds are normal. There is no distension.     Palpations: Abdomen is soft. There is no mass.     Tenderness: There is no abdominal tenderness. There is no right CVA tenderness, left CVA tenderness, guarding or rebound.  Genitourinary:    Comments: Deferred exam Musculoskeletal:        General: Normal range of motion.     Cervical back: Normal range of motion and neck supple. No tenderness.     Right lower leg: No edema.     Left lower leg: No edema.  Lymphadenopathy:     Cervical: No cervical adenopathy.  Skin:    General: Skin  is warm and dry.     Capillary Refill: Capillary refill takes less than 2 seconds.     Findings: Rash present.  Neurological:     General: No focal deficit present.     Mental Status: She is alert and oriented to person, place, and time. Mental status is at baseline.  Psychiatric:        Mood and Affect: Mood normal.        Behavior: Behavior normal.        Thought Content: Thought content normal.        Judgment: Judgment normal.         No results found for any visits on 01/21/24.     Assessment & Plan:    Routine Health Maintenance and Physical Exam Discussed health promotion and safety including diet and exercise recommendations, dental health, and injury prevention. Tobacco cessation if applicable. Seat belts, sunscreen, smoke detectors, etc.    Immunization History  Administered Date(s) Administered   PFIZER(Purple Top)SARS-COV-2 Vaccination 05/20/2019, 07/04/2019, 02/09/2020   Tdap 12/18/2022    Health Maintenance  Topic Date Due   HIV Screening  Never done   Diabetic kidney evaluation - Urine ACR  Never done   Hepatitis C Screening  Never done   Colonoscopy  Never done   OPHTHALMOLOGY EXAM  01/21/2024 (Originally 08/20/2023)   Influenza Vaccine  07/25/2024 (Originally 11/26/2023)   HPV VACCINES (1 - 3-dose SCDM series) 11/09/2024 (Originally 10/14/2005)   COVID-19 Vaccine (4 - 2025-26 season) 01/19/2025 (Originally 12/27/2023)   Pneumococcal Vaccine (1 of 2 - PCV) 01/20/2025 (Originally 10/14/1997)   Hepatitis B Vaccines 19-59 Average Risk (1 of 3 - 19+ 3-dose series) 01/20/2025 (Originally 10/14/1997)   Cervical Cancer Screening (HPV/Pap Cotest)  05/23/2024   HEMOGLOBIN A1C  07/11/2024   Diabetic kidney evaluation - eGFR measurement  01/11/2025   FOOT EXAM  01/20/2025   Mammogram  03/09/2025   DTaP/Tdap/Td (2 - Td or Tdap) 12/17/2032   Meningococcal B Vaccine  Aged Out        Problem List Items Addressed This Visit   None Visit Diagnoses       Annual  physical exam    -  Primary Labs recently done       Type 2 diabetes mellitus without complication, without long-term current use of insulin  (HCC)     Labs recently  done except for lipids and microalbumin/creatinine - will get today Continue current regimen and healthy lifestyle    Relevant Orders   Microalbumin / creatinine urine ratio   Lipid panel     Ringworm     Rash consistent with ringworm, did not resolve with topical steroids. Will try clotrimazole  cream. She has regular derm follow-up in a few weeks.    Relevant Medications   clotrimazole  (LOTRIMIN ) 1 % cream          PATIENT COUNSELING:    Recommend that most people either abstain from alcohol or drink within safe limits (<=14/week and <=4 drinks/occasion for males, <=7/weeks and <= 3 drinks/occasion for females) and that the risk for alcohol disorders and other health effects rises proportionally with the number of drinks per week and how often a drinker exceeds daily limits.   Diet: Recommend to adjust caloric intake to maintain or achieve ideal body weight, to reduce intake of dietary saturated fat and total fat, to limit sodium intake by avoiding high sodium foods and not adding table salt, and to maintain adequate dietary potassium and calcium preferably from fresh fruits, vegetables, and low-fat dairy products.   Emphasized the importance of regular exercise.  Injury prevention: Recommend seatbelts, safety helmets, smoke detector, etc..   Dental health: Recommend regular tooth brushing, flossing, and dental visits.       Return in about 6 months (around 07/20/2024) for chronic disease management.     Waddell KATHEE Mon, NP

## 2024-01-23 ENCOUNTER — Ambulatory Visit: Payer: Self-pay | Admitting: Family Medicine

## 2024-02-02 DIAGNOSIS — L308 Other specified dermatitis: Secondary | ICD-10-CM | POA: Diagnosis not present

## 2024-02-02 DIAGNOSIS — L0292 Furuncle, unspecified: Secondary | ICD-10-CM | POA: Diagnosis not present

## 2024-02-08 ENCOUNTER — Encounter: Payer: Self-pay | Admitting: Family Medicine

## 2024-02-08 ENCOUNTER — Other Ambulatory Visit: Payer: Self-pay | Admitting: Family Medicine

## 2024-02-14 DIAGNOSIS — L0292 Furuncle, unspecified: Secondary | ICD-10-CM | POA: Diagnosis not present

## 2024-03-05 ENCOUNTER — Other Ambulatory Visit: Payer: Self-pay | Admitting: Nurse Practitioner

## 2024-03-05 DIAGNOSIS — E119 Type 2 diabetes mellitus without complications: Secondary | ICD-10-CM

## 2024-04-05 ENCOUNTER — Ambulatory Visit: Admitting: Nurse Practitioner

## 2024-04-07 ENCOUNTER — Other Ambulatory Visit: Payer: Self-pay | Admitting: Nurse Practitioner

## 2024-04-07 DIAGNOSIS — E282 Polycystic ovarian syndrome: Secondary | ICD-10-CM

## 2024-04-07 DIAGNOSIS — E119 Type 2 diabetes mellitus without complications: Secondary | ICD-10-CM

## 2024-04-12 ENCOUNTER — Ambulatory Visit: Admitting: Nurse Practitioner

## 2024-04-12 ENCOUNTER — Ambulatory Visit: Payer: Self-pay

## 2024-04-12 ENCOUNTER — Encounter: Payer: Self-pay | Admitting: Nurse Practitioner

## 2024-04-12 VITALS — BP 115/74 | HR 67 | Temp 98.3°F | Ht 61.0 in | Wt 184.0 lb

## 2024-04-12 DIAGNOSIS — E119 Type 2 diabetes mellitus without complications: Secondary | ICD-10-CM | POA: Diagnosis not present

## 2024-04-12 DIAGNOSIS — E66811 Obesity, class 1: Secondary | ICD-10-CM

## 2024-04-12 DIAGNOSIS — D72829 Elevated white blood cell count, unspecified: Secondary | ICD-10-CM

## 2024-04-12 DIAGNOSIS — Z7985 Long-term (current) use of injectable non-insulin antidiabetic drugs: Secondary | ICD-10-CM | POA: Diagnosis not present

## 2024-04-12 DIAGNOSIS — Z6834 Body mass index (BMI) 34.0-34.9, adult: Secondary | ICD-10-CM | POA: Diagnosis not present

## 2024-04-12 DIAGNOSIS — Z79899 Other long term (current) drug therapy: Secondary | ICD-10-CM

## 2024-04-12 DIAGNOSIS — E6609 Other obesity due to excess calories: Secondary | ICD-10-CM

## 2024-04-12 MED ORDER — TIRZEPATIDE 10 MG/0.5ML ~~LOC~~ SOAJ: 10.0000 mg | SUBCUTANEOUS | 0 refills | Status: AC

## 2024-04-12 NOTE — Telephone Encounter (Signed)
 FYI Only or Action Required?: FYI only for provider: appointment scheduled on 04/13/2024 at 9 AM.  Patient was last seen in primary care on 04/12/2024 by Becki Krabbe, FNP.  Called Nurse Triage reporting Sore Throat.  Symptoms began yesterday.  Interventions attempted: Rest, hydration, or home remedies.  Symptoms are: unchanged.  Triage Disposition: See Physician Within 24 Hours  Patient/caregiver understands and will follow disposition?: Yes  Copied from CRM #[id number]. Topic: Clinical - Red Word Triage >> Apr 12, 2024  2:33 PM Nessti S wrote: [facility] that prompted transfer to Nurse Triage: throat pain   ----------------------------------------------------------------------- From previous Reason for Contact - Scheduling: Patient/patient representative is calling to schedule an appointment. Refer to attachments for appointment information. Reason for Disposition  SEVERE throat pain (e.g., excruciating)  Answer Assessment - Initial Assessment Questions Patient reports symptoms of a sore throat. Patient states symptoms started yesterday. Patient is requesting to be seen tomorrow. Scheduled patient to be seen by PCP tomorrow at 9 AM.   1. ONSET: When did the throat start hurting? (Hours or days ago)      Started yesterday 2. SEVERITY: How bad is the sore throat? (Scale 1-10; mild, moderate or severe)     5 to 6 out of 10 3. STREP EXPOSURE: Has there been any exposure to strep within the past week? If Yes, ask: What type of contact occurred?      unsure 4.  VIRAL SYMPTOMS: Are there any symptoms of a cold, such as a runny nose, cough, hoarse voice or red eyes?      Runny nose, sneezing 5. FEVER: Do you have a fever? If Yes, ask: What is your temperature, how was it measured, and when did it start?     no 6. PUS ON THE TONSILS: Is there pus on the tonsils in the back of your throat?     Patient reports she hasn't looked  7. OTHER SYMPTOMS: Do you have any  other symptoms? (e.g., difficulty breathing, headache, rash)     none 8. PREGNANCY: Is there any chance you are pregnant? When was your last menstrual period?     no  Protocols used: Sore Throat-A-AH

## 2024-04-12 NOTE — Progress Notes (Signed)
 Office: [phone]  /  Fax: [phone]  WEIGHT SUMMARY AND BIOMETRICS  Weight Lost Since Last Visit: 0lb  Weight Gained Since Last Visit: 0lb   Vitals Temp: 98.3 F (36.8 C) BP: 115/74 Pulse Rate: 67 SpO2: 97 %   Anthropometric Measurements Height: 5' 1 (1.549 m) Weight: 184 lb (83.5 kg) BMI (Calculated): 34.78 Weight at Last Visit: 184lb Weight Lost Since Last Visit: 0lb Weight Gained Since Last Visit: 0lb Starting Weight: 187lb Total Weight Loss (lbs): 3 lb (1.361 kg)   Body Composition  Body Fat %: 41 % Fat Mass (lbs): 75.8 lbs Muscle Mass (lbs): 103.4 lbs Total Body Water (lbs): 75.4 lbs Visceral Fat Rating : 10   Other Clinical Data Fasting: No Labs: No Today's Visit #: 20 Starting Date: 01/12/22     HPI  Chief Complaint: OBESITY  Kendra Bennett is here to discuss her progress with her obesity treatment plan. Kendra Bennett is on the the Category 2 Plan and states Kendra Bennett is following her eating plan approximately 70 % of the time. Kendra Bennett states Kendra Bennett is exercising 0 minutes 0 days per week.   Interval History:  Since last office visit Kendra Bennett has maintained her weight.  Kendra Bennett is struggling with meeting her calories and protein goals since increasing her Mounjaro  dose. Kendra Bennett is not tracking, weighing or measuring her food.   Kendra Bennett is not exercising.  Kendra Bennett hurt her arm back in Sept playing pickle ball.  Kendra Bennett hasn't seen anyone for the injury.  Notes it still hurts but is overall getting better.   Pharmacotherapy for weight loss: Kendra Bennett is not currently taking medications  for medical weight loss.    Previous pharmacotherapy for medical weight loss:  Wellbutrin -stopped due to side effects of blurry vision.   Bariatric surgery:  Patient has not had bariatric surgery.  Pharmacotherapy for DMT2:   Kendra Bennett is currently taking Mounjaro  12.4 mg and Metformin  500mg .  Denies side effects.   Last A1c was 5.8 on 01/12/24 CBGs: Kendra Bennett is not checking BS at home.   Episodes of hypoglycemia:  Denies On ARB, ASA 81mg  (Benicar  20mg ) Last eye exam:  Aug 2025 Kendra Bennett has tried Trulicity  and Metformin  in the past.  Kendra Bennett stopped Metformin  due to side effects of diarrhea.  Did take years ago prior and didn't have side effects.   Kendra Bennett is struggling with polyphagia and cravings  Lab Results  Component Value Date   HGBA1C 5.8 (H) 01/12/2024   HGBA1C 5.9 (H) 04/01/2023   HGBA1C 6.2 12/18/2022   Lab Results  Component Value Date   MICROALBUR <0.7 01/21/2024   LDLCALC 106 (H) 01/21/2024   CREATININE 0.76 01/12/2024    History of Vit B12 def Last labs with >2000. Kendra Bennett stopped her Vit B12 OTC and started taking a MVI.    Leukocytosis  Reports Kendra Bennett was sick last week.  Notes Kendra Bennett had fever, chills and coughing.  Would like to hold off on recheck labs today.       Latest Ref Rng & Units 01/12/2024    7:50 AM 12/31/2022    9:01 AM 12/18/2022    9:32 AM  CBC  WBC 3.4 - 10.8 x10E3/uL 13.3  8.9  11.7   Hemoglobin 11.1 - 15.9 g/dL 85.2  85.3  85.2   Hematocrit 34.0 - 46.6 % 45.5  44.7  45.0   Platelets 150 - 450 x10E3/uL 290  294  327.0      PHYSICAL EXAM:  Blood pressure 115/74, pulse 67, temperature 98.3 F (36.8 C),  height 5' 1 (1.549 m), weight 184 lb (83.5 kg), last menstrual period 04/10/2024, SpO2 97%. Body mass index is 34.77 kg/m.  General: Kendra Bennett is overweight, cooperative, alert, well developed, and in no acute distress. PSYCH: Has normal mood, affect and thought process.   Extremities: No edema.  Neurologic: No gross sensory or motor deficits. No tremors or fasciculations noted.    DIAGNOSTIC DATA REVIEWED:  BMET    Component Value Date/Time   NA 138 01/12/2024 0750   K 4.2 01/12/2024 0750   CL 103 01/12/2024 0750   CO2 19 (L) 01/12/2024 0750   GLUCOSE 118 (H) 01/12/2024 0750   GLUCOSE 94 12/18/2022 0932   BUN 13 01/12/2024 0750   CREATININE 0.76 01/12/2024 0750   CREATININE 0.61 09/05/2021 1453   CALCIUM 9.1 01/12/2024 0750   GFRNONAA >60 08/04/2015 1325   GFRAA  >60 08/04/2015 1325   Lab Results  Component Value Date   HGBA1C 5.8 (H) 01/12/2024   HGBA1C 7.1 (H) 09/05/2021   Lab Results  Component Value Date   INSULIN  23.1 01/12/2022   Lab Results  Component Value Date   TSH 1.720 01/12/2024   CBC    Component Value Date/Time   WBC 13.3 (H) 01/12/2024 0750   WBC 11.7 (H) 12/18/2022 0932   RBC 5.04 01/12/2024 0750   RBC 5.35 (H) 12/18/2022 0932   HGB 14.7 01/12/2024 0750   HCT 45.5 01/12/2024 0750   PLT 290 01/12/2024 0750   MCV 90 01/12/2024 0750   MCH 29.2 01/12/2024 0750   MCH 27.7 09/05/2021 1453   MCHC 32.3 01/12/2024 0750   MCHC 32.6 12/18/2022 0932   RDW 13.1 01/12/2024 0750   Iron Studies    Component Value Date/Time   IRON 53 12/31/2022 0901   FERRITIN 43 12/31/2022 0901   Lipid Panel     Component Value Date/Time   CHOL 168 01/21/2024 0922   CHOL 170 01/12/2022 0856   TRIG 118.0 01/21/2024 0922   HDL 37.90 (L) 01/21/2024 0922   HDL 48 01/12/2022 0856   CHOLHDL 4 01/21/2024 0922   VLDL 23.6 01/21/2024 0922   LDLCALC 106 (H) 01/21/2024 0922   LDLCALC 99 01/12/2022 0856   Hepatic Function Panel     Component Value Date/Time   PROT 6.7 01/12/2024 0750   ALBUMIN 4.0 01/12/2024 0750   AST 15 01/12/2024 0750   ALT 15 01/12/2024 0750   ALKPHOS 79 01/12/2024 0750   BILITOT 0.4 01/12/2024 0750      Component Value Date/Time   TSH 1.720 01/12/2024 0750   Nutritional Lab Results  Component Value Date   VD25OH 37.2 01/12/2024   VD25OH 62.39 12/18/2022   VD25OH 40.8 06/03/2022     ASSESSMENT AND PLAN  TREATMENT PLAN FOR OBESITY:  Recommended Dietary Goals  Kendra Bennett is currently in the action stage of change. As such, her goal is to continue weight management plan. Kendra Bennett has agreed to practicing portion control and making smarter food choices, such as increasing vegetables and decreasing simple carbohydrates.  Aim for 1300 calories and 100 grams of protein.    Behavioral Intervention  We discussed  the following Behavioral Modification Strategies today: increasing lean protein intake to established goals, decreasing simple carbohydrates , increasing vegetables, increasing fiber rich foods, increasing water intake , work on meal planning and preparation, reading food labels , keeping healthy foods at home, celebration eating strategies, continue to work on maintaining a reduced calorie state, getting the recommended amount of protein, incorporating whole foods,  making healthy choices, staying well hydrated and practicing mindfulness when eating., and increase protein intake, fibrous foods (25 grams per day for women, 30 grams for men) and water to improve satiety and decrease hunger signals. .  Additional resources provided today: NA  Recommended Physical Activity Goals  Kendra Bennett has been advised to work up to 150 minutes of moderate intensity aerobic activity a week and strengthening exercises 2-3 times per week for cardiovascular health, weight loss maintenance and preservation of muscle mass.   Kendra Bennett has agreed to Think about enjoyable ways to increase daily physical activity and overcoming barriers to exercise, Increase physical activity in their day and reduce sedentary time (increase NEAT)., Work on scheduling and tracking physical activity. , and Combine aerobic and strengthening exercises for efficiency and improved cardiometabolic health.   Discussed the importance of exercising-cardio and resistance training.    ASSOCIATED CONDITIONS ADDRESSED TODAY  Action/Plan  Type 2 diabetes mellitus without complication, without long-term current use of insulin  (HCC) -     Hemoglobin A1c -     Decrease Tirzepatide ; Inject 10 mg into the skin once a week.  Dispense: 6 mL; Refill: 0.  Side effects discussed.  Would like her to let me know how Kendra Bennett is doing concerning her calories and protein intake after decreasing the dose.    Leukocytosis, unspecified type -     CBC with  Differential/Platelet  To obtain labs in 2-3 weeks.    Medication management -     Comprehensive metabolic panel with GFR  Class 1 obesity due to excess calories with serious comorbidity and body mass index (BMI) of 34.0 to 34.9 in adult      Needs to schedule mammogram Labs reviewed in chart with patient on 01/12/24   Return in about 3 months (around 07/11/2024).Kendra Bennett Kendra Bennett was informed of the importance of frequent follow up visits to maximize her success with intensive lifestyle modifications for her multiple health conditions.   ATTESTASTION STATEMENTS:  Reviewed by clinician on day of visit: allergies, medications, problem list, medical history, surgical history, family history, social history, and previous encounter notes.     Kendra Bennett. Kendra Chappelle FNP-C

## 2024-04-12 NOTE — Telephone Encounter (Signed)
 Appt scheduled

## 2024-04-13 ENCOUNTER — Ambulatory Visit: Admitting: Family Medicine

## 2024-04-13 ENCOUNTER — Encounter: Payer: Self-pay | Admitting: Family Medicine

## 2024-04-13 VITALS — BP 107/44 | HR 71 | Temp 97.7°F | Ht 61.0 in | Wt 190.2 lb

## 2024-04-13 DIAGNOSIS — J069 Acute upper respiratory infection, unspecified: Secondary | ICD-10-CM | POA: Diagnosis not present

## 2024-04-13 DIAGNOSIS — J029 Acute pharyngitis, unspecified: Secondary | ICD-10-CM | POA: Diagnosis not present

## 2024-04-13 LAB — POC COVID19 BINAXNOW: SARS Coronavirus 2 Ag: NEGATIVE

## 2024-04-13 LAB — POCT INFLUENZA A/B
Influenza A, POC: NEGATIVE
Influenza B, POC: NEGATIVE

## 2024-04-13 LAB — POCT RAPID STREP A (OFFICE): Rapid Strep A Screen: NEGATIVE

## 2024-04-13 MED ORDER — GUAIFENESIN ER 600 MG PO TB12: 1200.0000 mg | ORAL_TABLET | Freq: Two times a day (BID) | ORAL | 0 refills | Status: DC

## 2024-04-13 MED ORDER — BENZONATATE 200 MG PO CAPS: 200.0000 mg | ORAL_CAPSULE | Freq: Two times a day (BID) | ORAL | 0 refills | Status: DC | PRN

## 2024-04-13 NOTE — Progress Notes (Signed)
 Acute Office Visit  Subjective:  Patient ID: Kendra Bennett, female    DOB: 1980-09-28  Age: 43 y.o. MRN: [id number]  CC:  Chief Complaint  Patient presents with   Sore Throat    Onset - 04/13/24 - Hard to talk, last night I felt feverish    Nasal Congestion    Onset - last night - runny nose & sneezing, feels like something is struck in my throat.  I don't feel like myself, not 100% myself      HPI Lourene Hoston is here for URI symptoms.   Discussed the use of AI scribe software for clinical note transcription with the patient, who gave verbal consent to proceed.  History of Present Illness Marianne Ann Tamas is a 43 year old female who presents with upper respiratory symptoms and fatigue.  Patient reports a day or two of cold-like symptoms last week, which resolved, but for the past 2-3 days she started feeling bad again.   The runny nose is described as clear, sometimes green, with congestion and runny nose being worse in the morning after lying flat all night. No shortness of breath or chest pain. She felt a sensation of fever last night but did not take her temperature and does not feel febrile today.  Her husband was sick about a week prior, and she acknowledges the prevalence of circulating viruses. She has been using Tylenol and Nyquil to manage her symptoms, which help her sleep at night.        Past Medical History:  Diagnosis Date   Anxiety    Diabetes (HCC)    Eczema    High blood pressure    PCOS (polycystic ovarian syndrome)    Vitamin D  deficiency     Past Surgical History:  Procedure Laterality Date   BREAST BIOPSY Right 04/08/2023   US  RT BREAST BX W LOC DEV 1ST LESION IMG BX SPEC US  GUIDE 04/08/2023 [facility]   BREAST BIOPSY Right 04/08/2023   US  RT BREAST BX W LOC DEV EA ADD LESION IMG BX SPEC US  GUIDE 04/08/2023 [facility]   WISDOM TOOTH EXTRACTION      Family History  Problem Relation Age of  Onset   High blood pressure Mother    Heart disease Mother    Anxiety disorder Mother    Diabetes Father    High blood pressure Father    High Cholesterol Father    Kidney disease Father     Social History   Socioeconomic History   Marital status: Married    Spouse name: Not on file   Number of children: Not on file   Years of education: Not on file   Highest education level: Doctorate  Occupational History   Not on file  Tobacco Use   Smoking status: Never   Smokeless tobacco: Not on file  Vaping Use   Vaping status: Never Used  Substance and Sexual Activity   Alcohol use: No   Drug use: No   Sexual activity: Yes    Birth control/protection: None  Other Topics Concern   Not on file  Social History Narrative   Not on file   Social Drivers of Health   Tobacco Use: Unknown (04/13/2024)   Patient History    Smoking Tobacco Use: Never    Smokeless Tobacco Use: Unknown    Passive Exposure: Not on file  Financial Resource Strain: Low Risk (11/20/2022)   Overall Financial Resource Strain (CARDIA)    Difficulty  of Paying Living Expenses: Not hard at all  Food Insecurity: No Food Insecurity (11/20/2022)   Hunger Vital Sign    Worried About Running Out of Food in the Last Year: Never true    Ran Out of Food in the Last Year: Never true  Transportation Needs: No Transportation Needs (11/20/2022)   PRAPARE - Administrator, Civil Service (Medical): No    Lack of Transportation (Non-Medical): No  Physical Activity: Insufficiently Active (11/20/2022)   Exercise Vital Sign    Days of Exercise per Week: 2 days    Minutes of Exercise per Session: 20 min  Stress: No Stress Concern Present (11/20/2022)   [employer] of Occupational Health - Occupational Stress Questionnaire    Feeling of Stress : Not at all  Social Connections: Socially Integrated (11/20/2022)   Social Connection and Isolation Panel    Frequency of Communication with Friends and Family: More than  three times a week    Frequency of Social Gatherings with Friends and Family: Once a week    Attends Religious Services: More than 4 times per year    Active Member of Clubs or Organizations: Yes    Attends Banker Meetings: More than 4 times per year    Marital Status: Married  Catering Manager Violence: Not on file  Depression (PHQ2-9): Low Risk (04/13/2024)   Depression (PHQ2-9)    PHQ-2 Score: 0  Alcohol Screen: Low Risk (11/20/2022)   Alcohol Screen    Last Alcohol Screening Score (AUDIT): 1  Housing: Low Risk (11/20/2022)   Housing    Last Housing Risk Score: 0  Utilities: Not on file  Health Literacy: Not on file    ROS All ROS negative except what is listed in the HPI.   Objective:   Today's Vitals: BP (!) 107/44 (BP Location: Left Arm, Patient Position: Sitting, Cuff Size: Large)   Pulse 71   Temp 97.7 F (36.5 C) (Oral)   Ht 5' 1 (1.549 m)   Wt 190 lb 3.2 oz (86.3 kg)   LMP 04/08/2024 (Exact Date)   SpO2 99%   BMI 35.94 kg/m   Physical Exam Vitals reviewed.  Constitutional:      Appearance: Normal appearance.  HENT:     Head: Normocephalic and atraumatic.     Right Ear: Tympanic membrane normal.     Left Ear: Tympanic membrane normal.     Nose: Congestion present.     Mouth/Throat:     Mouth: Mucous membranes are moist.     Pharynx: Oropharynx is clear.  Cardiovascular:     Rate and Rhythm: Normal rate and regular rhythm.     Heart sounds: Normal heart sounds.  Pulmonary:     Effort: Pulmonary effort is normal.     Breath sounds: Normal breath sounds. No wheezing, rhonchi or rales.  Skin:    General: Skin is warm and dry.  Neurological:     Mental Status: She is alert and oriented to person, place, and time.  Psychiatric:        Mood and Affect: Mood normal.        Behavior: Behavior normal.        Thought Content: Thought content normal.        Judgment: Judgment normal.          Assessment & Plan:   Problem List Items  Addressed This Visit   None Visit Diagnoses       Viral URI with cough    -  Primary   Relevant Medications   benzonatate  (TESSALON ) 200 MG capsule   guaiFENesin (MUCINEX) 600 MG 12 hr tablet     Sore throat       Relevant Orders   POC COVID-19 BinaxNow (Completed)   POCT Influenza A/B (Completed)   POCT rapid strep A (Completed)      Likely viral etiology. Negative for influenza, COVID-19, and streptococcal infection. Supportive care recommended. Consider antibiotics if symptoms persist. - Continue acetaminophen for symptom relief. - Use humidifier to alleviate symptoms. - Consider DayQuil or NyQuil for additional symptom relief, monitor acetaminophen dosage. - Use chloraseptic throat spray for sore throat relief. - Consider honey and lemon for throat comfort. - Rest and hydrate adequately. - Consider allergy pill for drainage relief. - Use saline nasal spray for nasal congestion. - Provided work note. - Advised wearing mask if working around others to prevent contagion. - Instructed to contact if symptoms worsen or do not improve by Monday.       Follow-up: Return if symptoms worsen or fail to improve.   Waddell FURY Almarie, DNP, FNP-C  I,Emily Lagle,acting as a neurosurgeon for Waddell KATHEE Almarie, NP.,have documented all relevant documentation on the behalf of Waddell KATHEE Almarie, NP.   I, Waddell KATHEE Almarie, NP, have reviewed all documentation for this visit. The documentation on 04/13/2024 for the exam, diagnosis, procedures, and orders are all accurate and complete.

## 2024-05-02 ENCOUNTER — Encounter: Payer: Self-pay | Admitting: Family Medicine

## 2024-05-02 DIAGNOSIS — Z1231 Encounter for screening mammogram for malignant neoplasm of breast: Secondary | ICD-10-CM

## 2024-05-02 NOTE — Progress Notes (Unsigned)
 No chief complaint on file.   Kendra Bennett here for URI complaints.  She was seen on 04/13/2024. Negative Flu, Covid, and strep. She was Dx with viral URI  Duration: {Numbers; 1-10:13787} {Time; day/wk/mo/yr:20843}  Associated symptoms: {URI Symptoms :210800001} Denies: {URI Symptoms :210800001} Treatment to date: *** Sick contacts: {yes/no:20286}  Past Medical History:  Diagnosis Date   Anxiety    Diabetes (HCC)    Eczema    High blood pressure    PCOS (polycystic ovarian syndrome)    Vitamin D  deficiency     Objective LMP 04/08/2024 (Exact Date)  General: Awake, alert, appears stated age HEENT: AT, [place], ears patent b/l and TM's neg, nares patent w/o discharge, pharynx pink and without exudates, MMM Neck: No masses or asymmetry Heart: RRR Lungs: CTAB, no accessory muscle use Psych: Age appropriate judgment and insight, normal mood and affect  No diagnosis found.  Continue to push fluids, practice good hand hygiene, cover mouth when coughing. F/u prn. If starting to experience fevers, shaking, or shortness of breath, seek immediate care. Pt voiced understanding and agreement to the plan.  Harlene LITTIE Jolly, DNP, AGNP-C 05/02/2024 5:07 PM

## 2024-05-03 ENCOUNTER — Ambulatory Visit: Admitting: Student

## 2024-05-03 ENCOUNTER — Encounter: Payer: Self-pay | Admitting: Student

## 2024-05-03 VITALS — BP 105/66 | HR 83 | Temp 98.2°F | Ht 61.0 in | Wt 192.8 lb

## 2024-05-03 DIAGNOSIS — R051 Acute cough: Secondary | ICD-10-CM | POA: Diagnosis not present

## 2024-05-03 DIAGNOSIS — J069 Acute upper respiratory infection, unspecified: Secondary | ICD-10-CM

## 2024-05-03 LAB — POCT INFLUENZA A/B
Influenza A, POC: NEGATIVE
Influenza B, POC: NEGATIVE

## 2024-05-03 MED ORDER — GUAIFENESIN ER 600 MG PO TB12: 1200.0000 mg | ORAL_TABLET | Freq: Two times a day (BID) | ORAL | 0 refills | Status: AC

## 2024-05-03 MED ORDER — BENZONATATE 100 MG PO CAPS: 100.0000 mg | ORAL_CAPSULE | Freq: Three times a day (TID) | ORAL | 0 refills | Status: AC | PRN

## 2024-05-03 MED ORDER — AMOXICILLIN-POT CLAVULANATE 875-125 MG PO TABS: 1.0000 | ORAL_TABLET | Freq: Two times a day (BID) | ORAL | 0 refills | Status: AC

## 2024-05-04 ENCOUNTER — Encounter: Payer: Self-pay | Admitting: Student

## 2024-05-04 ENCOUNTER — Encounter: Payer: Self-pay | Admitting: Obstetrics and Gynecology

## 2024-05-09 ENCOUNTER — Other Ambulatory Visit: Payer: Self-pay

## 2024-05-09 DIAGNOSIS — N644 Mastodynia: Secondary | ICD-10-CM

## 2024-05-09 NOTE — Progress Notes (Signed)
 Received verbal from Dr. Erik for diagnostic mammogram, orders placed. Message sent to patient.  Silvano LELON Piano, RN

## 2024-05-17 ENCOUNTER — Encounter: Payer: Self-pay | Admitting: Family Medicine

## 2024-05-17 ENCOUNTER — Other Ambulatory Visit: Payer: Self-pay | Admitting: Obstetrics and Gynecology

## 2024-05-17 DIAGNOSIS — N644 Mastodynia: Secondary | ICD-10-CM

## 2024-05-17 DIAGNOSIS — Z1211 Encounter for screening for malignant neoplasm of colon: Secondary | ICD-10-CM

## 2024-06-14 ENCOUNTER — Encounter

## 2024-06-14 ENCOUNTER — Other Ambulatory Visit

## 2024-07-05 ENCOUNTER — Ambulatory Visit: Admitting: Nurse Practitioner
